# Patient Record
Sex: Female | Born: 1950 | Race: White | Hispanic: No | Marital: Married | Smoking: Never smoker
Health system: Southern US, Community
[De-identification: ages and names within clinical notes are randomized; demographics above are authoritative.]

## PROBLEM LIST (undated history)

## (undated) HISTORY — PX: OTHER SURGICAL HISTORY: SHX169

---

## 1998-04-26 ENCOUNTER — Ambulatory Visit (HOSPITAL_COMMUNITY): Admission: RE | Admit: 1998-04-26 | Discharge: 1998-04-26 | Payer: Self-pay | Admitting: Internal Medicine

## 1998-04-26 ENCOUNTER — Encounter: Payer: Self-pay | Admitting: Internal Medicine

## 1998-06-08 ENCOUNTER — Other Ambulatory Visit: Admission: RE | Admit: 1998-06-08 | Discharge: 1998-06-08 | Payer: Self-pay | Admitting: *Deleted

## 2000-04-02 ENCOUNTER — Ambulatory Visit (HOSPITAL_COMMUNITY): Admission: RE | Admit: 2000-04-02 | Discharge: 2000-04-02 | Payer: Self-pay | Admitting: *Deleted

## 2000-04-29 ENCOUNTER — Other Ambulatory Visit: Admission: RE | Admit: 2000-04-29 | Discharge: 2000-04-29 | Payer: Self-pay | Admitting: *Deleted

## 2001-12-01 ENCOUNTER — Other Ambulatory Visit: Admission: RE | Admit: 2001-12-01 | Discharge: 2001-12-01 | Payer: Self-pay | Admitting: *Deleted

## 2002-02-04 ENCOUNTER — Ambulatory Visit (HOSPITAL_COMMUNITY): Admission: RE | Admit: 2002-02-04 | Discharge: 2002-02-04 | Payer: Self-pay | Admitting: *Deleted

## 2003-02-11 ENCOUNTER — Encounter: Admission: RE | Admit: 2003-02-11 | Discharge: 2003-02-11 | Payer: Self-pay | Admitting: *Deleted

## 2003-05-30 ENCOUNTER — Other Ambulatory Visit: Admission: RE | Admit: 2003-05-30 | Discharge: 2003-05-30 | Payer: Self-pay | Admitting: *Deleted

## 2003-10-13 ENCOUNTER — Encounter: Admission: RE | Admit: 2003-10-13 | Discharge: 2003-10-13 | Payer: Self-pay | Admitting: Internal Medicine

## 2004-05-09 ENCOUNTER — Encounter: Admission: RE | Admit: 2004-05-09 | Discharge: 2004-05-09 | Payer: Self-pay | Admitting: *Deleted

## 2005-05-31 ENCOUNTER — Encounter: Admission: RE | Admit: 2005-05-31 | Discharge: 2005-05-31 | Payer: Self-pay | Admitting: *Deleted

## 2006-10-30 ENCOUNTER — Encounter: Admission: RE | Admit: 2006-10-30 | Discharge: 2006-10-30 | Payer: Self-pay | Admitting: Internal Medicine

## 2007-11-05 ENCOUNTER — Encounter: Admission: RE | Admit: 2007-11-05 | Discharge: 2007-11-05 | Payer: Self-pay | Admitting: Internal Medicine

## 2008-12-22 ENCOUNTER — Encounter: Admission: RE | Admit: 2008-12-22 | Discharge: 2008-12-22 | Payer: Self-pay | Admitting: Obstetrics and Gynecology

## 2010-05-14 ENCOUNTER — Other Ambulatory Visit: Payer: Self-pay | Admitting: Internal Medicine

## 2010-05-14 DIAGNOSIS — Z1231 Encounter for screening mammogram for malignant neoplasm of breast: Secondary | ICD-10-CM

## 2010-05-21 ENCOUNTER — Ambulatory Visit
Admission: RE | Admit: 2010-05-21 | Discharge: 2010-05-21 | Disposition: A | Discharge status: Home / Self Care | Payer: BC Managed Care – PPO | Source: Ambulatory Visit | Attending: Internal Medicine | Admitting: Internal Medicine

## 2010-05-21 DIAGNOSIS — Z1231 Encounter for screening mammogram for malignant neoplasm of breast: Secondary | ICD-10-CM

## 2010-05-23 ENCOUNTER — Other Ambulatory Visit: Payer: Self-pay | Admitting: Internal Medicine

## 2010-05-23 DIAGNOSIS — R928 Other abnormal and inconclusive findings on diagnostic imaging of breast: Secondary | ICD-10-CM

## 2010-05-25 ENCOUNTER — Ambulatory Visit
Admission: RE | Admit: 2010-05-25 | Discharge: 2010-05-25 | Disposition: A | Discharge status: Home / Self Care | Payer: BC Managed Care – PPO | Source: Ambulatory Visit | Attending: Internal Medicine | Admitting: Internal Medicine

## 2010-05-25 DIAGNOSIS — R928 Other abnormal and inconclusive findings on diagnostic imaging of breast: Secondary | ICD-10-CM

## 2011-04-29 ENCOUNTER — Other Ambulatory Visit: Payer: Self-pay | Admitting: Interventional Radiology

## 2011-04-29 DIAGNOSIS — I83819 Varicose veins of unspecified lower extremities with pain: Secondary | ICD-10-CM

## 2011-05-08 ENCOUNTER — Ambulatory Visit
Admission: RE | Admit: 2011-05-08 | Discharge: 2011-05-08 | Disposition: A | Discharge status: Home / Self Care | Payer: BC Managed Care – PPO | Source: Ambulatory Visit | Attending: Interventional Radiology | Admitting: Interventional Radiology

## 2011-05-08 DIAGNOSIS — I83819 Varicose veins of unspecified lower extremities with pain: Secondary | ICD-10-CM

## 2011-06-27 ENCOUNTER — Other Ambulatory Visit: Payer: Self-pay | Admitting: Interventional Radiology

## 2011-06-27 DIAGNOSIS — I83812 Varicose veins of left lower extremities with pain: Secondary | ICD-10-CM

## 2011-08-14 ENCOUNTER — Ambulatory Visit
Admission: RE | Admit: 2011-08-14 | Discharge: 2011-08-14 | Disposition: A | Discharge status: Home / Self Care | Payer: BC Managed Care – PPO | Source: Ambulatory Visit | Attending: Interventional Radiology | Admitting: Interventional Radiology

## 2011-08-14 DIAGNOSIS — I83812 Varicose veins of left lower extremities with pain: Secondary | ICD-10-CM

## 2011-08-19 ENCOUNTER — Other Ambulatory Visit: Payer: Self-pay | Admitting: Interventional Radiology

## 2011-08-19 DIAGNOSIS — I83812 Varicose veins of left lower extremities with pain: Secondary | ICD-10-CM

## 2011-10-17 ENCOUNTER — Ambulatory Visit
Admission: RE | Admit: 2011-10-17 | Discharge: 2011-10-17 | Disposition: A | Discharge status: Home / Self Care | Payer: BC Managed Care – PPO | Source: Ambulatory Visit | Attending: Interventional Radiology | Admitting: Interventional Radiology

## 2011-10-17 VITALS — BP 91/75 | HR 68 | Temp 97.5°F | Resp 16

## 2011-10-17 DIAGNOSIS — I83812 Varicose veins of left lower extremities with pain: Secondary | ICD-10-CM

## 2011-10-17 NOTE — Progress Notes (Signed)
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  0950  22 gauge x 1" insyte catheter started IV Right antecubital (1 attempt) with Saline Lock & 7" extension.  Flushed w/ 10 ml NSS w/o difficulty.  Site unremarkable.  1015 Procedure started. 1110  Procedure completed.  Patient tolerated well.  1120  Wearing thigh high graduated compression garment on LLE.   1130  IV d/c'd.  Catheter intact.  Site unremarkable.  1135  Given verbal & written discharge instructions.  Patient states that she understands.  1140  Ambulated x 10 mins w/o assistance.  Gait steady.  Tolerated well.  1150  Discharged to home.  Husband to drive.

## 2011-11-05 ENCOUNTER — Other Ambulatory Visit: Payer: Self-pay | Admitting: Interventional Radiology

## 2011-11-05 ENCOUNTER — Ambulatory Visit
Admission: RE | Admit: 2011-11-05 | Discharge: 2011-11-05 | Disposition: A | Discharge status: Home / Self Care | Payer: BC Managed Care – PPO | Source: Ambulatory Visit | Attending: Interventional Radiology | Admitting: Interventional Radiology

## 2011-11-05 ENCOUNTER — Ambulatory Visit: Payer: BC Managed Care – PPO

## 2011-11-05 DIAGNOSIS — I83812 Varicose veins of left lower extremities with pain: Secondary | ICD-10-CM

## 2011-11-08 ENCOUNTER — Other Ambulatory Visit: Payer: Self-pay | Admitting: Internal Medicine

## 2011-11-08 DIAGNOSIS — Z1231 Encounter for screening mammogram for malignant neoplasm of breast: Secondary | ICD-10-CM

## 2011-11-11 ENCOUNTER — Other Ambulatory Visit: Payer: BC Managed Care – PPO

## 2011-11-11 ENCOUNTER — Ambulatory Visit: Payer: BC Managed Care – PPO

## 2011-12-03 ENCOUNTER — Ambulatory Visit
Admission: RE | Admit: 2011-12-03 | Discharge: 2011-12-03 | Disposition: A | Discharge status: Home / Self Care | Payer: BC Managed Care – PPO | Source: Ambulatory Visit | Attending: Interventional Radiology | Admitting: Interventional Radiology

## 2011-12-03 DIAGNOSIS — I83812 Varicose veins of left lower extremities with pain: Secondary | ICD-10-CM

## 2011-12-10 ENCOUNTER — Ambulatory Visit
Admission: RE | Admit: 2011-12-10 | Discharge: 2011-12-10 | Disposition: A | Discharge status: Home / Self Care | Payer: BC Managed Care – PPO | Source: Ambulatory Visit | Attending: Internal Medicine | Admitting: Internal Medicine

## 2011-12-10 DIAGNOSIS — Z1231 Encounter for screening mammogram for malignant neoplasm of breast: Secondary | ICD-10-CM

## 2012-03-04 ENCOUNTER — Other Ambulatory Visit (HOSPITAL_COMMUNITY): Payer: Self-pay | Admitting: Interventional Radiology

## 2012-03-04 DIAGNOSIS — I83812 Varicose veins of left lower extremities with pain: Secondary | ICD-10-CM

## 2012-04-15 ENCOUNTER — Ambulatory Visit
Admission: RE | Admit: 2012-04-15 | Discharge: 2012-04-15 | Disposition: A | Discharge status: Home / Self Care | Payer: BC Managed Care – PPO | Source: Ambulatory Visit | Attending: Interventional Radiology | Admitting: Interventional Radiology

## 2012-04-15 DIAGNOSIS — I83812 Varicose veins of left lower extremities with pain: Secondary | ICD-10-CM

## 2012-12-09 ENCOUNTER — Other Ambulatory Visit: Payer: Self-pay

## 2012-12-09 DIAGNOSIS — Z1231 Encounter for screening mammogram for malignant neoplasm of breast: Secondary | ICD-10-CM

## 2013-01-18 ENCOUNTER — Ambulatory Visit
Admission: RE | Admit: 2013-01-18 | Discharge: 2013-01-18 | Disposition: A | Discharge status: Home / Self Care | Payer: BC Managed Care – PPO | Source: Ambulatory Visit

## 2013-01-18 DIAGNOSIS — Z1231 Encounter for screening mammogram for malignant neoplasm of breast: Secondary | ICD-10-CM

## 2013-12-31 ENCOUNTER — Other Ambulatory Visit: Payer: Self-pay

## 2013-12-31 DIAGNOSIS — Z1231 Encounter for screening mammogram for malignant neoplasm of breast: Secondary | ICD-10-CM

## 2014-01-17 ENCOUNTER — Ambulatory Visit
Admission: RE | Admit: 2014-01-17 | Discharge: 2014-01-17 | Disposition: A | Discharge status: Home / Self Care | Payer: BC Managed Care – PPO | Source: Ambulatory Visit

## 2014-01-17 ENCOUNTER — Encounter (INDEPENDENT_AMBULATORY_CARE_PROVIDER_SITE_OTHER): Payer: Self-pay

## 2014-01-17 DIAGNOSIS — Z1231 Encounter for screening mammogram for malignant neoplasm of breast: Secondary | ICD-10-CM

## 2014-03-11 ENCOUNTER — Ambulatory Visit (INDEPENDENT_AMBULATORY_CARE_PROVIDER_SITE_OTHER): Payer: BLUE CROSS/BLUE SHIELD | Admitting: Internal Medicine

## 2014-03-11 DIAGNOSIS — Z23 Encounter for immunization: Secondary | ICD-10-CM | POA: Diagnosis not present

## 2014-03-11 DIAGNOSIS — Z7184 Encounter for health counseling related to travel: Secondary | ICD-10-CM | POA: Insufficient documentation

## 2014-03-11 DIAGNOSIS — Z7189 Other specified counseling: Secondary | ICD-10-CM

## 2014-03-11 MED ORDER — AZITHROMYCIN 500 MG PO TABS: 1000.0000 mg | ORAL_TABLET | Freq: Once | ORAL | Status: DC

## 2014-03-11 NOTE — Progress Notes (Signed)
Subjective:   Lisa Beltran is a 64 y.o. female who presents to the Infectious Disease clinic for travel consultation. Planned departure date: March 2016          Planned return date: 1 week Countries of travel: [place]  Areas in country: rural   Accommodations: hotel Purpose of travel: [employer] Prior travel out of [place]: has been to the same place previously     Objective:   Scheduled Meds: Continuous Infusions: PRN Meds:. Medications: reviewed    Assessment:    No contraindications to travel. none     Plan:    Issues discussed: freshwater swimming, MVA safety, rabies, safe food/water, traveler's diarrhea, website/handouts for more information, what to do if ill upon return and what to do if ill while there. Immunizations recommended: Hepatitis A series. Malaria prophylaxis: not indicated Traveler's diarrhea prophylaxis: azithromycin, has tendon issues. Total duration of visit: 1 Hour. Total time spent on education, counseling, coordination of care: 30 Minutes.

## 2015-02-22 ENCOUNTER — Other Ambulatory Visit: Payer: Self-pay

## 2015-02-22 DIAGNOSIS — Z1231 Encounter for screening mammogram for malignant neoplasm of breast: Secondary | ICD-10-CM

## 2015-03-01 ENCOUNTER — Ambulatory Visit
Admission: RE | Admit: 2015-03-01 | Discharge: 2015-03-01 | Disposition: A | Discharge status: Home / Self Care | Payer: BLUE CROSS/BLUE SHIELD | Source: Ambulatory Visit

## 2015-03-01 DIAGNOSIS — Z1231 Encounter for screening mammogram for malignant neoplasm of breast: Secondary | ICD-10-CM

## 2016-02-29 ENCOUNTER — Other Ambulatory Visit: Payer: Self-pay | Admitting: Internal Medicine

## 2016-02-29 DIAGNOSIS — Z1231 Encounter for screening mammogram for malignant neoplasm of breast: Secondary | ICD-10-CM

## 2016-03-13 ENCOUNTER — Ambulatory Visit
Admission: RE | Admit: 2016-03-13 | Discharge: 2016-03-13 | Disposition: A | Discharge status: Home / Self Care | Payer: BLUE CROSS/BLUE SHIELD | Source: Ambulatory Visit | Attending: Internal Medicine | Admitting: Internal Medicine

## 2016-03-13 DIAGNOSIS — Z1231 Encounter for screening mammogram for malignant neoplasm of breast: Secondary | ICD-10-CM

## 2016-09-19 DIAGNOSIS — H35342 Macular cyst, hole, or pseudohole, left eye: Secondary | ICD-10-CM | POA: Diagnosis not present

## 2016-09-19 DIAGNOSIS — H33102 Unspecified retinoschisis, left eye: Secondary | ICD-10-CM | POA: Diagnosis not present

## 2016-09-19 DIAGNOSIS — H35372 Puckering of macula, left eye: Secondary | ICD-10-CM | POA: Diagnosis not present

## 2016-09-19 DIAGNOSIS — H35412 Lattice degeneration of retina, left eye: Secondary | ICD-10-CM | POA: Diagnosis not present

## 2016-10-14 DIAGNOSIS — D1801 Hemangioma of skin and subcutaneous tissue: Secondary | ICD-10-CM | POA: Diagnosis not present

## 2016-10-14 DIAGNOSIS — L814 Other melanin hyperpigmentation: Secondary | ICD-10-CM | POA: Diagnosis not present

## 2016-10-14 DIAGNOSIS — C44619 Basal cell carcinoma of skin of left upper limb, including shoulder: Secondary | ICD-10-CM | POA: Diagnosis not present

## 2016-10-14 DIAGNOSIS — Z85828 Personal history of other malignant neoplasm of skin: Secondary | ICD-10-CM | POA: Diagnosis not present

## 2016-10-14 DIAGNOSIS — D485 Neoplasm of uncertain behavior of skin: Secondary | ICD-10-CM | POA: Diagnosis not present

## 2016-10-14 DIAGNOSIS — D225 Melanocytic nevi of trunk: Secondary | ICD-10-CM | POA: Diagnosis not present

## 2016-10-14 DIAGNOSIS — R238 Other skin changes: Secondary | ICD-10-CM | POA: Diagnosis not present

## 2016-10-14 DIAGNOSIS — L821 Other seborrheic keratosis: Secondary | ICD-10-CM | POA: Diagnosis not present

## 2016-10-14 DIAGNOSIS — Z23 Encounter for immunization: Secondary | ICD-10-CM | POA: Diagnosis not present

## 2016-10-14 DIAGNOSIS — L570 Actinic keratosis: Secondary | ICD-10-CM | POA: Diagnosis not present

## 2016-11-01 DIAGNOSIS — H35372 Puckering of macula, left eye: Secondary | ICD-10-CM | POA: Diagnosis not present

## 2016-11-01 DIAGNOSIS — H2513 Age-related nuclear cataract, bilateral: Secondary | ICD-10-CM | POA: Diagnosis not present

## 2016-11-01 DIAGNOSIS — H04123 Dry eye syndrome of bilateral lacrimal glands: Secondary | ICD-10-CM | POA: Diagnosis not present

## 2016-11-18 DIAGNOSIS — L905 Scar conditions and fibrosis of skin: Secondary | ICD-10-CM | POA: Diagnosis not present

## 2016-11-18 DIAGNOSIS — L570 Actinic keratosis: Secondary | ICD-10-CM | POA: Diagnosis not present

## 2016-11-18 DIAGNOSIS — C44629 Squamous cell carcinoma of skin of left upper limb, including shoulder: Secondary | ICD-10-CM | POA: Diagnosis not present

## 2016-12-31 DIAGNOSIS — M859 Disorder of bone density and structure, unspecified: Secondary | ICD-10-CM | POA: Diagnosis not present

## 2016-12-31 DIAGNOSIS — R82998 Other abnormal findings in urine: Secondary | ICD-10-CM | POA: Diagnosis not present

## 2016-12-31 DIAGNOSIS — E038 Other specified hypothyroidism: Secondary | ICD-10-CM | POA: Diagnosis not present

## 2016-12-31 DIAGNOSIS — E7849 Other hyperlipidemia: Secondary | ICD-10-CM | POA: Diagnosis not present

## 2017-01-07 DIAGNOSIS — J3089 Other allergic rhinitis: Secondary | ICD-10-CM | POA: Diagnosis not present

## 2017-01-07 DIAGNOSIS — I868 Varicose veins of other specified sites: Secondary | ICD-10-CM | POA: Diagnosis not present

## 2017-01-07 DIAGNOSIS — Z1212 Encounter for screening for malignant neoplasm of rectum: Secondary | ICD-10-CM | POA: Diagnosis not present

## 2017-01-07 DIAGNOSIS — E038 Other specified hypothyroidism: Secondary | ICD-10-CM | POA: Diagnosis not present

## 2017-01-07 DIAGNOSIS — Z1389 Encounter for screening for other disorder: Secondary | ICD-10-CM | POA: Diagnosis not present

## 2017-01-07 DIAGNOSIS — Z23 Encounter for immunization: Secondary | ICD-10-CM | POA: Diagnosis not present

## 2017-01-07 DIAGNOSIS — G43909 Migraine, unspecified, not intractable, without status migrainosus: Secondary | ICD-10-CM | POA: Diagnosis not present

## 2017-01-07 DIAGNOSIS — R3121 Asymptomatic microscopic hematuria: Secondary | ICD-10-CM | POA: Diagnosis not present

## 2017-01-07 DIAGNOSIS — K5909 Other constipation: Secondary | ICD-10-CM | POA: Diagnosis not present

## 2017-01-07 DIAGNOSIS — E7849 Other hyperlipidemia: Secondary | ICD-10-CM | POA: Diagnosis not present

## 2017-01-07 DIAGNOSIS — M859 Disorder of bone density and structure, unspecified: Secondary | ICD-10-CM | POA: Diagnosis not present

## 2017-01-07 DIAGNOSIS — Z681 Body mass index (BMI) 19 or less, adult: Secondary | ICD-10-CM | POA: Diagnosis not present

## 2017-01-07 DIAGNOSIS — Z0000 Encounter for general adult medical examination without abnormal findings: Secondary | ICD-10-CM | POA: Diagnosis not present

## 2017-01-07 DIAGNOSIS — H35379 Puckering of macula, unspecified eye: Secondary | ICD-10-CM | POA: Diagnosis not present

## 2017-01-27 DIAGNOSIS — M859 Disorder of bone density and structure, unspecified: Secondary | ICD-10-CM | POA: Diagnosis not present

## 2017-03-03 ENCOUNTER — Other Ambulatory Visit: Payer: Self-pay | Admitting: Internal Medicine

## 2017-03-03 DIAGNOSIS — Z1231 Encounter for screening mammogram for malignant neoplasm of breast: Secondary | ICD-10-CM

## 2017-03-21 ENCOUNTER — Ambulatory Visit
Admission: RE | Admit: 2017-03-21 | Discharge: 2017-03-21 | Disposition: A | Discharge status: Home / Self Care | Payer: PPO | Source: Ambulatory Visit | Attending: Internal Medicine | Admitting: Internal Medicine

## 2017-03-21 DIAGNOSIS — Z1231 Encounter for screening mammogram for malignant neoplasm of breast: Secondary | ICD-10-CM | POA: Diagnosis not present

## 2017-05-27 DIAGNOSIS — M26609 Unspecified temporomandibular joint disorder, unspecified side: Secondary | ICD-10-CM | POA: Diagnosis not present

## 2017-05-27 DIAGNOSIS — M279 Disease of jaws, unspecified: Secondary | ICD-10-CM | POA: Diagnosis not present

## 2017-06-04 DIAGNOSIS — L570 Actinic keratosis: Secondary | ICD-10-CM | POA: Diagnosis not present

## 2017-06-04 DIAGNOSIS — L821 Other seborrheic keratosis: Secondary | ICD-10-CM | POA: Diagnosis not present

## 2017-06-30 DIAGNOSIS — E038 Other specified hypothyroidism: Secondary | ICD-10-CM | POA: Diagnosis not present

## 2017-09-18 DIAGNOSIS — H35412 Lattice degeneration of retina, left eye: Secondary | ICD-10-CM | POA: Diagnosis not present

## 2017-09-18 DIAGNOSIS — H43812 Vitreous degeneration, left eye: Secondary | ICD-10-CM | POA: Diagnosis not present

## 2017-09-18 DIAGNOSIS — H33102 Unspecified retinoschisis, left eye: Secondary | ICD-10-CM | POA: Diagnosis not present

## 2017-09-18 DIAGNOSIS — H35342 Macular cyst, hole, or pseudohole, left eye: Secondary | ICD-10-CM | POA: Diagnosis not present

## 2017-09-18 DIAGNOSIS — H35372 Puckering of macula, left eye: Secondary | ICD-10-CM | POA: Diagnosis not present

## 2017-10-15 DIAGNOSIS — L821 Other seborrheic keratosis: Secondary | ICD-10-CM | POA: Diagnosis not present

## 2017-10-15 DIAGNOSIS — L814 Other melanin hyperpigmentation: Secondary | ICD-10-CM | POA: Diagnosis not present

## 2017-10-15 DIAGNOSIS — D1801 Hemangioma of skin and subcutaneous tissue: Secondary | ICD-10-CM | POA: Diagnosis not present

## 2017-10-15 DIAGNOSIS — D225 Melanocytic nevi of trunk: Secondary | ICD-10-CM | POA: Diagnosis not present

## 2017-10-15 DIAGNOSIS — L570 Actinic keratosis: Secondary | ICD-10-CM | POA: Diagnosis not present

## 2017-10-15 DIAGNOSIS — Z85828 Personal history of other malignant neoplasm of skin: Secondary | ICD-10-CM | POA: Diagnosis not present

## 2018-01-01 DIAGNOSIS — E038 Other specified hypothyroidism: Secondary | ICD-10-CM | POA: Diagnosis not present

## 2018-01-01 DIAGNOSIS — R82998 Other abnormal findings in urine: Secondary | ICD-10-CM | POA: Diagnosis not present

## 2018-01-01 DIAGNOSIS — M859 Disorder of bone density and structure, unspecified: Secondary | ICD-10-CM | POA: Diagnosis not present

## 2018-01-01 DIAGNOSIS — E7849 Other hyperlipidemia: Secondary | ICD-10-CM | POA: Diagnosis not present

## 2018-01-08 DIAGNOSIS — I868 Varicose veins of other specified sites: Secondary | ICD-10-CM | POA: Diagnosis not present

## 2018-01-08 DIAGNOSIS — J309 Allergic rhinitis, unspecified: Secondary | ICD-10-CM | POA: Diagnosis not present

## 2018-01-08 DIAGNOSIS — M810 Age-related osteoporosis without current pathological fracture: Secondary | ICD-10-CM | POA: Diagnosis not present

## 2018-01-08 DIAGNOSIS — G43909 Migraine, unspecified, not intractable, without status migrainosus: Secondary | ICD-10-CM | POA: Diagnosis not present

## 2018-01-08 DIAGNOSIS — Z681 Body mass index (BMI) 19 or less, adult: Secondary | ICD-10-CM | POA: Diagnosis not present

## 2018-01-08 DIAGNOSIS — E785 Hyperlipidemia, unspecified: Secondary | ICD-10-CM | POA: Diagnosis not present

## 2018-01-08 DIAGNOSIS — Z1389 Encounter for screening for other disorder: Secondary | ICD-10-CM | POA: Diagnosis not present

## 2018-01-08 DIAGNOSIS — H3530 Unspecified macular degeneration: Secondary | ICD-10-CM | POA: Diagnosis not present

## 2018-01-08 DIAGNOSIS — E039 Hypothyroidism, unspecified: Secondary | ICD-10-CM | POA: Diagnosis not present

## 2018-01-08 DIAGNOSIS — Z23 Encounter for immunization: Secondary | ICD-10-CM | POA: Diagnosis not present

## 2018-01-08 DIAGNOSIS — Z0000 Encounter for general adult medical examination without abnormal findings: Secondary | ICD-10-CM | POA: Diagnosis not present

## 2018-01-08 DIAGNOSIS — K5900 Constipation, unspecified: Secondary | ICD-10-CM | POA: Diagnosis not present

## 2018-01-12 ENCOUNTER — Other Ambulatory Visit: Payer: Self-pay | Admitting: Internal Medicine

## 2018-01-12 DIAGNOSIS — E785 Hyperlipidemia, unspecified: Secondary | ICD-10-CM

## 2018-01-28 ENCOUNTER — Ambulatory Visit
Admission: RE | Admit: 2018-01-28 | Discharge: 2018-01-28 | Disposition: A | Discharge status: Home / Self Care | Payer: No Typology Code available for payment source | Source: Ambulatory Visit | Attending: Internal Medicine | Admitting: Internal Medicine

## 2018-01-28 DIAGNOSIS — E785 Hyperlipidemia, unspecified: Secondary | ICD-10-CM

## 2018-03-02 DIAGNOSIS — H2513 Age-related nuclear cataract, bilateral: Secondary | ICD-10-CM | POA: Diagnosis not present

## 2018-03-02 DIAGNOSIS — H5213 Myopia, bilateral: Secondary | ICD-10-CM | POA: Diagnosis not present

## 2018-03-02 DIAGNOSIS — H04123 Dry eye syndrome of bilateral lacrimal glands: Secondary | ICD-10-CM | POA: Diagnosis not present

## 2018-03-02 DIAGNOSIS — H35372 Puckering of macula, left eye: Secondary | ICD-10-CM | POA: Diagnosis not present

## 2018-03-09 ENCOUNTER — Other Ambulatory Visit: Payer: Self-pay | Admitting: Internal Medicine

## 2018-03-09 DIAGNOSIS — Z1231 Encounter for screening mammogram for malignant neoplasm of breast: Secondary | ICD-10-CM

## 2018-03-11 DIAGNOSIS — D3910 Neoplasm of uncertain behavior of unspecified ovary: Secondary | ICD-10-CM | POA: Diagnosis not present

## 2018-03-11 DIAGNOSIS — Z01419 Encounter for gynecological examination (general) (routine) without abnormal findings: Secondary | ICD-10-CM | POA: Diagnosis not present

## 2018-04-02 ENCOUNTER — Ambulatory Visit: Payer: PPO

## 2018-04-09 DIAGNOSIS — R05 Cough: Secondary | ICD-10-CM | POA: Diagnosis not present

## 2018-04-14 ENCOUNTER — Ambulatory Visit: Payer: PPO

## 2018-05-18 ENCOUNTER — Ambulatory Visit: Payer: PPO

## 2018-06-29 ENCOUNTER — Ambulatory Visit
Admission: RE | Admit: 2018-06-29 | Discharge: 2018-06-29 | Disposition: A | Discharge status: Home / Self Care | Payer: PPO | Source: Ambulatory Visit | Attending: Internal Medicine | Admitting: Internal Medicine

## 2018-06-29 ENCOUNTER — Other Ambulatory Visit: Payer: Self-pay

## 2018-06-29 DIAGNOSIS — Z1231 Encounter for screening mammogram for malignant neoplasm of breast: Secondary | ICD-10-CM

## 2018-09-23 DIAGNOSIS — H33102 Unspecified retinoschisis, left eye: Secondary | ICD-10-CM | POA: Diagnosis not present

## 2018-09-23 DIAGNOSIS — H35342 Macular cyst, hole, or pseudohole, left eye: Secondary | ICD-10-CM | POA: Diagnosis not present

## 2018-09-23 DIAGNOSIS — H35372 Puckering of macula, left eye: Secondary | ICD-10-CM | POA: Diagnosis not present

## 2018-09-23 DIAGNOSIS — H25042 Posterior subcapsular polar age-related cataract, left eye: Secondary | ICD-10-CM | POA: Diagnosis not present

## 2018-10-21 DIAGNOSIS — D225 Melanocytic nevi of trunk: Secondary | ICD-10-CM | POA: Diagnosis not present

## 2018-10-21 DIAGNOSIS — Z85828 Personal history of other malignant neoplasm of skin: Secondary | ICD-10-CM | POA: Diagnosis not present

## 2018-10-21 DIAGNOSIS — L570 Actinic keratosis: Secondary | ICD-10-CM | POA: Diagnosis not present

## 2018-10-21 DIAGNOSIS — L820 Inflamed seborrheic keratosis: Secondary | ICD-10-CM | POA: Diagnosis not present

## 2018-10-21 DIAGNOSIS — L814 Other melanin hyperpigmentation: Secondary | ICD-10-CM | POA: Diagnosis not present

## 2018-10-21 DIAGNOSIS — L821 Other seborrheic keratosis: Secondary | ICD-10-CM | POA: Diagnosis not present

## 2018-12-10 ENCOUNTER — Other Ambulatory Visit: Payer: Self-pay

## 2018-12-10 DIAGNOSIS — Z20822 Contact with and (suspected) exposure to covid-19: Secondary | ICD-10-CM

## 2018-12-13 LAB — NOVEL CORONAVIRUS, NAA: SARS-CoV-2, NAA: NOT DETECTED

## 2019-01-04 DIAGNOSIS — E038 Other specified hypothyroidism: Secondary | ICD-10-CM | POA: Diagnosis not present

## 2019-01-04 DIAGNOSIS — M810 Age-related osteoporosis without current pathological fracture: Secondary | ICD-10-CM | POA: Diagnosis not present

## 2019-01-04 DIAGNOSIS — E7849 Other hyperlipidemia: Secondary | ICD-10-CM | POA: Diagnosis not present

## 2019-01-11 DIAGNOSIS — M810 Age-related osteoporosis without current pathological fracture: Secondary | ICD-10-CM | POA: Diagnosis not present

## 2019-01-11 DIAGNOSIS — R3121 Asymptomatic microscopic hematuria: Secondary | ICD-10-CM | POA: Diagnosis not present

## 2019-01-11 DIAGNOSIS — E785 Hyperlipidemia, unspecified: Secondary | ICD-10-CM | POA: Diagnosis not present

## 2019-01-11 DIAGNOSIS — Z0000 Encounter for general adult medical examination without abnormal findings: Secondary | ICD-10-CM | POA: Diagnosis not present

## 2019-01-11 DIAGNOSIS — H35379 Puckering of macula, unspecified eye: Secondary | ICD-10-CM | POA: Diagnosis not present

## 2019-01-11 DIAGNOSIS — H3530 Unspecified macular degeneration: Secondary | ICD-10-CM | POA: Diagnosis not present

## 2019-01-11 DIAGNOSIS — K5900 Constipation, unspecified: Secondary | ICD-10-CM | POA: Diagnosis not present

## 2019-01-11 DIAGNOSIS — G43909 Migraine, unspecified, not intractable, without status migrainosus: Secondary | ICD-10-CM | POA: Diagnosis not present

## 2019-01-11 DIAGNOSIS — I868 Varicose veins of other specified sites: Secondary | ICD-10-CM | POA: Diagnosis not present

## 2019-01-11 DIAGNOSIS — J309 Allergic rhinitis, unspecified: Secondary | ICD-10-CM | POA: Diagnosis not present

## 2019-01-11 DIAGNOSIS — E039 Hypothyroidism, unspecified: Secondary | ICD-10-CM | POA: Diagnosis not present

## 2019-01-11 DIAGNOSIS — Z1331 Encounter for screening for depression: Secondary | ICD-10-CM | POA: Diagnosis not present

## 2019-02-17 ENCOUNTER — Ambulatory Visit: Payer: PPO

## 2019-02-26 ENCOUNTER — Ambulatory Visit: Payer: PPO

## 2019-03-04 DIAGNOSIS — H5211 Myopia, right eye: Secondary | ICD-10-CM | POA: Diagnosis not present

## 2019-03-04 DIAGNOSIS — H35372 Puckering of macula, left eye: Secondary | ICD-10-CM | POA: Diagnosis not present

## 2019-03-04 DIAGNOSIS — H2511 Age-related nuclear cataract, right eye: Secondary | ICD-10-CM | POA: Diagnosis not present

## 2019-03-04 DIAGNOSIS — H25812 Combined forms of age-related cataract, left eye: Secondary | ICD-10-CM | POA: Diagnosis not present

## 2019-03-04 DIAGNOSIS — H5212 Myopia, left eye: Secondary | ICD-10-CM | POA: Diagnosis not present

## 2019-03-06 ENCOUNTER — Ambulatory Visit: Payer: PPO

## 2019-07-01 ENCOUNTER — Other Ambulatory Visit: Payer: Self-pay | Admitting: Internal Medicine

## 2019-07-01 DIAGNOSIS — Z1231 Encounter for screening mammogram for malignant neoplasm of breast: Secondary | ICD-10-CM

## 2019-07-28 DIAGNOSIS — H5712 Ocular pain, left eye: Secondary | ICD-10-CM | POA: Diagnosis not present

## 2019-07-30 ENCOUNTER — Ambulatory Visit: Payer: PPO

## 2019-08-06 ENCOUNTER — Other Ambulatory Visit: Payer: Self-pay

## 2019-08-06 ENCOUNTER — Ambulatory Visit
Admission: RE | Admit: 2019-08-06 | Discharge: 2019-08-06 | Disposition: A | Discharge status: Home / Self Care | Payer: PPO | Source: Ambulatory Visit | Attending: Internal Medicine | Admitting: Internal Medicine

## 2019-08-06 DIAGNOSIS — Z1231 Encounter for screening mammogram for malignant neoplasm of breast: Secondary | ICD-10-CM

## 2019-09-29 ENCOUNTER — Ambulatory Visit (INDEPENDENT_AMBULATORY_CARE_PROVIDER_SITE_OTHER): Payer: PPO | Admitting: Ophthalmology

## 2019-09-29 ENCOUNTER — Other Ambulatory Visit: Payer: Self-pay

## 2019-09-29 ENCOUNTER — Encounter (INDEPENDENT_AMBULATORY_CARE_PROVIDER_SITE_OTHER): Payer: Self-pay | Admitting: Ophthalmology

## 2019-09-29 DIAGNOSIS — H25042 Posterior subcapsular polar age-related cataract, left eye: Secondary | ICD-10-CM | POA: Diagnosis not present

## 2019-09-29 DIAGNOSIS — H35371 Puckering of macula, right eye: Secondary | ICD-10-CM | POA: Diagnosis not present

## 2019-09-29 DIAGNOSIS — H2513 Age-related nuclear cataract, bilateral: Secondary | ICD-10-CM | POA: Diagnosis not present

## 2019-09-29 DIAGNOSIS — H35372 Puckering of macula, left eye: Secondary | ICD-10-CM

## 2019-09-29 NOTE — Assessment & Plan Note (Signed)

## 2019-09-29 NOTE — Assessment & Plan Note (Signed)

## 2019-09-29 NOTE — Patient Instructions (Signed)
Patient to report any new onset of flashes floaters distortion of vision or decline in vision Retinal Detachment  Retinal detachment occurs when the thin membrane that covers the back of the eye (retina) separates (detaches) from the eyeball. This causes vision loss. Vision loss may range from blurriness or cloudiness to complete blindness. Sometimes vision improves or returns after treatment. It is important to get treatment for retinal detachment as soon as possible. There are 3 types of retinal detachment:  Rhegmatogenous. This type: ? Is most common. ? Happens when a tear in the retina lets fluid into the area behind the retina. ? Causes the retina to detach from the layer of cells that is under it (retinal pigment epithelium, RPE).  Tractional. This type: ? Happens when scar tissue on the surface of the retina tightens (contracts). ? Causes the retina to detach from its base.  Exudative. This type happens when a disorder or an injury causes fluid to leak into the area behind the retina. What are the causes? Retinal detachment may be caused by:  Holes or tears in the retina.  Eye injury.  Certain diseases, including diabetes.  Other eye conditions, such as nearsightedness (myopia) or myopia that worsens (degenerative myopia). What increases the risk? You are more likely to develop this condition if you:  Have a family history of retinal detachment.  Are age 3 or older.  Are female.  Have a history of: ? Cataract surgery. ? Eye injury. ? Retinal detachment.  Are nearsighted.  Have diabetes.  Have high blood pressure (hypertension).  Have certain other eye problems. What are the signs or symptoms? Symptoms of retinal detachment include:  Seeing flashes of light.  Seeing shapes (floaters) in front of your eye that may look like wavy lines or cobwebs.  Having blurry vision.  Seeing a floating empty circle in front of you.  Vision loss. This may: ? Be partial  or complete. ? Be temporary or permanent. ? Affect central vision, side (peripheral) vision, or both. Loss of central vision is more likely to be permanent than a loss of peripheral vision is. ? Look as if a curtain is blocking part of your vision. If you develop symptoms of retinal detachment, you should see an eye care provider as soon as possible, ideally within 12-24 hours. How is this diagnosed? This condition may be diagnosed based on:  Your symptoms and medical history.  An eye exam that is done by a health care provider who specializes in eye conditions (ophthalmologist).  Various tests of your vision and eye health.  Imaging of the eye, including ultrasound. How is this treated? Treatment for this condition may include:  Using a focused beam of energy to seal the tear or hole in the retina (laser treatment). If your retina has only a small tear or hole and has not completely detached, this may be the only treatment that you need.  Freezing the area around the tear or hole in the retina (cryopexy). This helps to reattach the retina to the surface of the eyeball.  Surgery to place a small band (scleral buckle) on the eyeball. The scleral buckle pushes the eyeball against the retina so that the retina can be reattached with laser treatment or cryopexy.  Surgery to remove the gel that is inside the white of the eye (vitrectomy). Gas is injected into the white of the eye (sclera) to push the eyeball back against the retina (pneumatic retinopexy). Then, the retina can be reattached with laser  treatment or cryopexy. Follow these instructions at home:  Do not drive or use heavy machinery until your health care provider approves. Ask your health care provider what activities are safe for you.  Take over-the-counter and prescription medicines only as told by your health care provider.  Wear eye protection as needed, such as while using heavy machinery or playing sports.  If you have  diabetes, work with your health care provider to manage your blood sugar (glucose) and blood pressure. High blood glucose and high blood pressure can cause damage to your eyes, which may lead to retinal detachment.  Get an eye exam one or more times a year.  Keep all follow-up visits as told by your health care provider. This is important. Contact a health care provider if:  Your vision is not improving as your health care provider said you could expect.  Your vision gets worse. Get help right away if:  You suddenly see flashing lights or floaters.  You suddenly have a dark area in your field of vision, especially in the lower part. This can lead to a loss of central vision. Summary  Retinal detachment occurs when the thin membrane that covers the back of the eye (retina) separates (detaches) from the eyeball. This condition causes vision loss.  Retinal detachment is uncommon, but it can cause permanent loss of vision.  If you develop symptoms of a retinal detachment, you should see an eye care provider as soon as possible, ideally within 12-24 hours.  Treatment may include laser treatment, freezing the area around the tear or hole in the retina (cryopexy), or surgery. This information is not intended to replace advice given to you by your health care provider. Make sure you discuss any questions you have with your health care provider. Document Revised: 12/20/2016 Document Reviewed: 12/06/2016 Elsevier Patient Education  2020 [employer].

## 2019-09-29 NOTE — Progress Notes (Signed)
09/29/2019     CHIEF COMPLAINT Patient presents for Retina Follow Up   HISTORY OF PRESENT ILLNESS: Lisa Beltran is a 69 y.o. female who presents to the clinic today for:   HPI    Retina Follow Up    Patient presents with  Other.  In both eyes.  This started 1 year ago.  Severity is mild.  Duration of 1 year.  Since onset it is stable.          Comments    1 Year F/U OU  Pt denies noticeable changes to [place] OU since last visit. Pt denies ocular pain, flashes of light, or floaters OU.         Last edited by Rockie Neighbours, [place] on 09/29/2019  8:15 AM. (History)      Referring physician: Shon Baton, MD [place],  [place] [address]  HISTORICAL INFORMATION:   Selected notes from the [facility]: No current outpatient medications on file. (Ophthalmic Drugs)   No current facility-administered medications for this visit. (Ophthalmic Drugs)   Current Outpatient Medications (Other)  Medication Sig  . azithromycin (ZITHROMAX) 500 MG tablet Take 2 tablets (1,000 mg total) by mouth once. Take 2 tabs once for Traveler's diarrhea  . meloxicam (MOBIC) 15 MG tablet   . SYNTHROID 75 MCG tablet Take 75 mcg by mouth daily.   No current facility-administered medications for this visit. (Other)      REVIEW OF SYSTEMS:    ALLERGIES Allergies  Allergen Reactions  . Penicillins Rash    PAST MEDICAL HISTORY History reviewed. No pertinent past medical history. History reviewed. No pertinent surgical history.  FAMILY HISTORY History reviewed. No pertinent family history.  SOCIAL HISTORY Social History   Tobacco Use  . Smoking status: Never Smoker  . Smokeless tobacco: Never Used  Substance Use Topics  . Alcohol use: Yes    Comment: 1 drink/week  . Drug use: No         OPHTHALMIC EXAM: Base Eye Exam    Visual Acuity (ETDRS)      Right Left   Dist cc 20/30 -2 20/25 -2   Dist ph cc 20/25 -1    Correction:  Glasses       Tonometry (Tonopen, 8:16 AM)      Right Left   Pressure 09 11       Pupils      Pupils Dark Light Shape React APD   Right PERRL 4 3 Round Brisk None   Left PERRL 4 3 Round Brisk None       Visual Fields (Counting fingers)      Left Right    Full Full       Extraocular Movement      Right Left    Full Full       Neuro/Psych    Oriented x3: Yes   Mood/Affect: Normal       Dilation    Both eyes: 1.0% Mydriacyl, 2.5% Phenylephrine @ 8:19 AM        Slit Lamp and Fundus Exam    External Exam      Right Left   External Normal Normal       Slit Lamp Exam      Right Left   Lids/Lashes Normal Normal   Conjunctiva/Sclera White and quiet White and quiet   Cornea Clear Clear   Anterior Chamber Deep and quiet Deep and quiet  Iris Round and reactive Round and reactive   Lens 1+ Nuclear sclerosis 1+ Nuclear sclerosis   Anterior Vitreous Normal Normal       Fundus Exam      Right Left   Posterior Vitreous Posterior vitreous detachment Posterior vitreous detachment   Disc Normal Normal   C/D Ratio 0.35 0.35   Macula Normal Epiretinal membrane, no topographic distortion   Vessels Normal Normal   Periphery Lattice degeneration inferiorly, no retinal holes or breaks. Lattice degeneration inferiorly, no retinal holes or breaks.          IMAGING AND PROCEDURES  Imaging and Procedures for 09/29/19  OCT, Retina - OU - Both Eyes       Right Eye Quality was good. Scan locations included subfoveal. Central Foveal Thickness: 308. Progression has improved.   Left Eye Quality was good. Scan locations included subfoveal. Central Foveal Thickness: 351. Progression has improved. Findings include cystoid macular edema, epiretinal membrane.   Notes No topographic distortion to the macula left eye from mild vitreal macular traction and epiretinal membrane.  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 The patient was reassured that surgery at a later date does not put the patient at risk for a worse outcome. It was emphasized that the need for surgery is dictated by the patient's quality of life as influenced by the cataract. Patient was instructed to maintain close follow up with their general eye care doctor.  Left epiretinal membrane The nature of macular pucker (epiretinal membrane ERM) was discussed with the patient as well as threshold criteria for vitrectomy surgery. I explained that in rare cases another surgery is needed to actually remove a second wrinkle should it regrow.  Most often, the epiretinal membrane and underlying wrinkled internal limiting membrane are removed with the first surgery, to accomplish the goals.   If the operative eye is [place] (natural lens still present), cataract surgery is often recommended prior to Vitrectomy. This will enable the retina surgeon to have the best view during surgery and the patient to obtain optimal results in the future. Treatment options were discussed.      ICD-10-CM   1. Left epiretinal membrane  H35.372 OCT, Retina - OU - Both Eyes  2. 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 Right epiretinal membrane  H35.371 OCT, Retina - OU - Both Eyes  4. Nuclear sclerotic cataract of both eyes  H25.13     1.  I described the nature of epiretinal membrane if there is no current effect on the fovea of the left or the right eye.  2.  Moreover I use the analogy of sunglasses for describing cataract developing symptoms in either eye usually affecting nighttime vision or dark rainy days initially.  3.  Retinal tears are not present in either eye.  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  No new findings today.  Observe  Ophthalmic Meds  Ordered this visit:  No orders of the defined types were placed in this encounter.      Return in about 1 year (around 09/28/2020) for DILATE OU, OCT.  There are no Patient Instructions on file for this visit.   Explained the diagnoses, plan, and  follow up with the patient and they expressed understanding.  Patient expressed understanding of the importance of proper follow up care.   Clent Demark Yaron Grasse M.D. Diseases & Surgery of the Retina and Vitreous Retina & Diabetic [place] 09/29/19     Abbreviations: M myopia (nearsighted); A astigmatism; H hyperopia (farsighted); P presbyopia; Mrx spectacle prescription;  CTL contact lenses; OD right eye; OS left eye; OU both eyes  XT exotropia; ET esotropia; PEK punctate epithelial keratitis; PEE punctate epithelial erosions; DES dry eye syndrome; MGD meibomian gland dysfunction; ATs artificial tears; PFAT's preservative free artificial tears; [place] nuclear sclerotic cataract; PSC posterior subcapsular cataract; ERM epi-retinal membrane; PVD posterior vitreous detachment; RD retinal detachment; DM diabetes mellitus; DR diabetic retinopathy; NPDR non-proliferative diabetic retinopathy; PDR proliferative diabetic retinopathy; CSME clinically significant macular edema; DME diabetic macular edema; dbh dot blot hemorrhages; CWS cotton wool spot; POAG primary open angle glaucoma; C/D cup-to-disc ratio; HVF humphrey visual field; GVF goldmann visual field; OCT optical coherence tomography; IOP intraocular pressure; BRVO Branch retinal vein occlusion; CRVO central retinal vein occlusion; CRAO central retinal artery occlusion; BRAO branch retinal artery occlusion; RT retinal tear; SB scleral buckle; PPV pars plana vitrectomy; VH Vitreous hemorrhage; PRP panretinal laser photocoagulation; IVK intravitreal kenalog; VMT vitreomacular traction; MH Macular hole;  NVD neovascularization of the disc; NVE neovascularization elsewhere; AREDS age related eye disease study; ARMD  age related macular degeneration; POAG primary open angle glaucoma; EBMD epithelial/anterior basement membrane dystrophy; ACIOL anterior chamber intraocular lens; IOL intraocular lens; PCIOL posterior chamber intraocular lens; Phaco/IOL phacoemulsification with intraocular lens placement; [place] photorefractive keratectomy; LASIK laser assisted in situ keratomileusis; HTN hypertension; DM diabetes mellitus; COPD chronic obstructive pulmonary disease

## 2019-10-19 DIAGNOSIS — L570 Actinic keratosis: Secondary | ICD-10-CM | POA: Diagnosis not present

## 2019-10-19 DIAGNOSIS — D485 Neoplasm of uncertain behavior of skin: Secondary | ICD-10-CM | POA: Diagnosis not present

## 2019-10-19 DIAGNOSIS — L578 Other skin changes due to chronic exposure to nonionizing radiation: Secondary | ICD-10-CM | POA: Diagnosis not present

## 2019-10-19 DIAGNOSIS — L821 Other seborrheic keratosis: Secondary | ICD-10-CM | POA: Diagnosis not present

## 2019-10-19 DIAGNOSIS — Z85828 Personal history of other malignant neoplasm of skin: Secondary | ICD-10-CM | POA: Diagnosis not present

## 2019-10-19 DIAGNOSIS — D225 Melanocytic nevi of trunk: Secondary | ICD-10-CM | POA: Diagnosis not present

## 2019-10-19 DIAGNOSIS — L814 Other melanin hyperpigmentation: Secondary | ICD-10-CM | POA: Diagnosis not present

## 2019-10-19 DIAGNOSIS — L820 Inflamed seborrheic keratosis: Secondary | ICD-10-CM | POA: Diagnosis not present

## 2019-11-18 DIAGNOSIS — L570 Actinic keratosis: Secondary | ICD-10-CM | POA: Diagnosis not present

## 2020-03-22 DIAGNOSIS — H25812 Combined forms of age-related cataract, left eye: Secondary | ICD-10-CM | POA: Diagnosis not present

## 2020-03-22 DIAGNOSIS — H04123 Dry eye syndrome of bilateral lacrimal glands: Secondary | ICD-10-CM | POA: Diagnosis not present

## 2020-03-22 DIAGNOSIS — H35372 Puckering of macula, left eye: Secondary | ICD-10-CM | POA: Diagnosis not present

## 2020-03-22 DIAGNOSIS — H2511 Age-related nuclear cataract, right eye: Secondary | ICD-10-CM | POA: Diagnosis not present

## 2020-08-05 DIAGNOSIS — Z20822 Contact with and (suspected) exposure to covid-19: Secondary | ICD-10-CM | POA: Diagnosis not present

## 2020-08-24 ENCOUNTER — Other Ambulatory Visit: Payer: Self-pay

## 2020-08-24 ENCOUNTER — Ambulatory Visit
Admission: RE | Admit: 2020-08-24 | Discharge: 2020-08-24 | Disposition: A | Discharge status: Home / Self Care | Payer: PPO | Source: Ambulatory Visit | Attending: Internal Medicine | Admitting: Internal Medicine

## 2020-08-24 ENCOUNTER — Other Ambulatory Visit: Payer: Self-pay | Admitting: Internal Medicine

## 2020-08-24 DIAGNOSIS — Z1231 Encounter for screening mammogram for malignant neoplasm of breast: Secondary | ICD-10-CM | POA: Diagnosis not present

## 2020-09-28 ENCOUNTER — Encounter (INDEPENDENT_AMBULATORY_CARE_PROVIDER_SITE_OTHER): Payer: PPO | Admitting: Ophthalmology

## 2020-10-16 ENCOUNTER — Other Ambulatory Visit: Payer: Self-pay

## 2020-10-16 ENCOUNTER — Ambulatory Visit (INDEPENDENT_AMBULATORY_CARE_PROVIDER_SITE_OTHER): Payer: PPO | Admitting: Ophthalmology

## 2020-10-16 ENCOUNTER — Encounter (INDEPENDENT_AMBULATORY_CARE_PROVIDER_SITE_OTHER): Payer: Self-pay | Admitting: Ophthalmology

## 2020-10-16 DIAGNOSIS — H2513 Age-related nuclear cataract, bilateral: Secondary | ICD-10-CM

## 2020-10-16 DIAGNOSIS — H35372 Puckering of macula, left eye: Secondary | ICD-10-CM

## 2020-10-16 DIAGNOSIS — H25042 Posterior subcapsular polar age-related cataract, left eye: Secondary | ICD-10-CM | POA: Diagnosis not present

## 2020-10-16 DIAGNOSIS — H35371 Puckering of macula, right eye: Secondary | ICD-10-CM

## 2020-10-16 NOTE — Assessment & Plan Note (Addendum)
The nature of cataract was discussed with the patient as well as the elective nature of surgery. The patient was reassured that surgery at a later date does not put the patient at risk for a worse outcome. It was emphasized that the need for surgery is dictated by the patient's quality of life as influenced by the cataract. Patient was instructed to maintain close follow up with their general eye care doctor.  This is a new finding and I explained to the patient that she might start to develop glare and should follow-up with [facility] for further evaluation and monitoring

## 2020-10-16 NOTE — Assessment & Plan Note (Signed)
Minor with no topographic distortion we will continue to observe

## 2020-10-16 NOTE — Progress Notes (Signed)
10/16/2020     CHIEF COMPLAINT Patient presents for  Chief Complaint  Patient presents with   Retina Follow Up      HISTORY OF PRESENT ILLNESS: Lisa Beltran is a 70 y.o. female who presents to the clinic today for:   HPI     Retina Follow Up   Patient presents with  Other.  In both eyes.  This started 1 year ago.  Duration of 1 year.        Comments   1 year f/u OU with OCT  Pt states she is not able to determine if her vision has changed much over the past year. Pt states she thinks her vision may seem a bit worse when she is not wearing her CL's. Pt denies any new flashes or floaters. Pt denies any eye pain.  Eye Meds: None      Last edited by Lisa Beltran, COA on 10/16/2020  8:28 AM.      Referring physician: Shon Baton, MD [address] [place],  [place] [address]  HISTORICAL INFORMATION:   Selected notes from the [facility]: No current outpatient medications on file. (Ophthalmic Drugs)   No current facility-administered medications for this visit. (Ophthalmic Drugs)   Current Outpatient Medications (Other)  Medication Sig   azithromycin (ZITHROMAX) 500 MG tablet Take 2 tablets (1,000 mg total) by mouth once. 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   No current facility-administered medications for this visit. (Other)      REVIEW OF SYSTEMS:    ALLERGIES Allergies  Allergen Reactions   Penicillins Rash    PAST MEDICAL HISTORY History reviewed. No pertinent past medical history. History reviewed. No pertinent surgical history.  FAMILY HISTORY History reviewed. No pertinent family history.  SOCIAL HISTORY Social History   Tobacco Use   Smoking status: Never   Smokeless tobacco: Never  Substance Use Topics   Alcohol use: Yes    Comment: 1 drink/week   Drug use: No         OPHTHALMIC  EXAM:  Base Eye Exam     Visual Acuity (ETDRS)       Right Left   Dist cc 20/40 +2 20/25 -2   Dist ph cc 20/30 -1     Correction: Contacts         Tonometry (Tonopen, 8:35 AM)       Right Left   Pressure 10 8         Pupils       Pupils Dark Light Shape React APD   Right PERRL 5 4 Round Brisk None   Left PERRL 5 4 Round Brisk None         Visual Fields (Counting fingers)       Left Right    Full Full         Extraocular Movement       Right Left    Full, Ortho Full, Ortho         Neuro/Psych     Oriented x3: Yes   Mood/Affect: Normal         Dilation     Both eyes: 1.0% Mydriacyl, 2.5% Phenylephrine @ 8:35 AM           Slit Lamp and Fundus Exam     External Exam  Right Left   External Normal Normal         Slit Lamp Exam       Right Left   Lids/Lashes Normal Normal   Conjunctiva/Sclera White and quiet White and quiet   Cornea Clear Clear   Anterior Chamber Deep and quiet Deep and quiet   Iris Round and reactive Round and reactive   Lens 1+ Nuclear sclerosis 1+ Nuclear sclerosis, 1+ Posterior subcapsular cataract   Anterior Vitreous Normal Normal         Fundus Exam       Right Left   Posterior Vitreous Posterior vitreous detachment Posterior vitreous detachment   Disc Normal Normal   C/D Ratio 0.35 0.35   Macula Normal,  Epiretinal membrane, no topographic distortion   Vessels Normal Normal   Periphery Lattice degeneration inferiorly, no retinal holes or breaks. Lattice degeneration inferiorly, no retinal holes or breaks.            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 The patient was reassured that surgery at a later date does not put the patient at risk for a worse outcome. It was  emphasized that the need for surgery is dictated by the patient's quality of life as influenced by the cataract. Patient was instructed to maintain close follow up with their general eye care doctor.  This is a new finding and I explained to the patient that she might start to develop glare and should follow-up with [facility] for further evaluation and monitoring  Left epiretinal membrane Mild epiretinal membrane still no topographic distortion no impact on acuity OS observe  Nuclear sclerotic cataract of both eyes Bilateral mild NSC changes.  No impact on acuity  Right epiretinal membrane Minor with no topographic distortion we will continue to observe     ICD-10-CM   1. Left epiretinal membrane  H35.372 OCT, Retina - OU - Both Eyes    2. 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 Nuclear sclerotic cataract of both eyes  H25.13     4. Right epiretinal membrane  H35.371       1.  OU with minor epiretinal membrane with no impact on acuity  2.  OU with minor nuclear sclerotic changes yet today OS has new onset PSC finding per my exam.  This may be a small posterior pole or and may be not visually significant yet patient was reviewed of the symptoms.  Follow-up with [facility] regarding this issue as scheduled February 2023  3.  Ophthalmic Meds Ordered this visit:  No orders of the defined types were placed in this encounter.      Return in about 1 year (around 10/16/2021) for DILATE OU, COLOR FP, OCT.  There are no Patient Instructions on file for this visit.   Explained the diagnoses, plan, and follow up with the patient and they expressed understanding.  Patient expressed understanding of the importance of proper follow up care.   Lisa Beltran M.D. Diseases & Surgery of the Retina and Vitreous Retina & Diabetic [place] 10/16/20     Abbreviations: M myopia (nearsighted); A astigmatism; H hyperopia (farsighted); P  presbyopia; Mrx spectacle prescription;  CTL contact lenses; OD right  eye; OS left eye; OU both eyes  XT exotropia; ET esotropia; PEK punctate epithelial keratitis; PEE punctate epithelial erosions; DES dry eye syndrome; MGD meibomian gland dysfunction; ATs artificial tears; PFAT's preservative free artificial tears; [place] nuclear sclerotic cataract; PSC posterior subcapsular cataract; ERM epi-retinal membrane; PVD posterior vitreous detachment; RD retinal detachment; DM diabetes mellitus; DR diabetic retinopathy; NPDR non-proliferative diabetic retinopathy; PDR proliferative diabetic retinopathy; CSME clinically significant macular edema; DME diabetic macular edema; dbh dot blot hemorrhages; CWS cotton wool spot; POAG primary open angle glaucoma; C/D cup-to-disc ratio; HVF humphrey visual field; GVF goldmann visual field; OCT optical coherence tomography; IOP intraocular pressure; BRVO Branch retinal vein occlusion; CRVO central retinal vein occlusion; CRAO central retinal artery occlusion; BRAO branch retinal artery occlusion; RT retinal tear; SB scleral buckle; PPV pars plana vitrectomy; VH Vitreous hemorrhage; PRP panretinal laser photocoagulation; IVK intravitreal kenalog; VMT vitreomacular traction; MH Macular hole;  NVD neovascularization of the disc; NVE neovascularization elsewhere; AREDS age related eye disease study; ARMD age related macular degeneration; POAG primary open angle glaucoma; EBMD epithelial/anterior basement membrane dystrophy; ACIOL anterior chamber intraocular lens; IOL intraocular lens; PCIOL posterior chamber intraocular lens; Phaco/IOL phacoemulsification with intraocular lens placement; [place] photorefractive keratectomy; LASIK laser assisted in situ keratomileusis; HTN hypertension; DM diabetes mellitus; COPD chronic obstructive pulmonary disease

## 2020-10-16 NOTE — Assessment & Plan Note (Signed)
Mild epiretinal membrane still no topographic distortion no impact on acuity OS observe

## 2020-10-16 NOTE — Assessment & Plan Note (Signed)
Bilateral mild NSC changes.  No impact on acuity

## 2020-10-17 DIAGNOSIS — M859 Disorder of bone density and structure, unspecified: Secondary | ICD-10-CM | POA: Diagnosis not present

## 2020-10-17 DIAGNOSIS — E039 Hypothyroidism, unspecified: Secondary | ICD-10-CM | POA: Diagnosis not present

## 2020-10-17 DIAGNOSIS — E785 Hyperlipidemia, unspecified: Secondary | ICD-10-CM | POA: Diagnosis not present

## 2020-10-18 DIAGNOSIS — L821 Other seborrheic keratosis: Secondary | ICD-10-CM | POA: Diagnosis not present

## 2020-10-18 DIAGNOSIS — L820 Inflamed seborrheic keratosis: Secondary | ICD-10-CM | POA: Diagnosis not present

## 2020-10-18 DIAGNOSIS — L570 Actinic keratosis: Secondary | ICD-10-CM | POA: Diagnosis not present

## 2020-10-18 DIAGNOSIS — L814 Other melanin hyperpigmentation: Secondary | ICD-10-CM | POA: Diagnosis not present

## 2020-10-18 DIAGNOSIS — D225 Melanocytic nevi of trunk: Secondary | ICD-10-CM | POA: Diagnosis not present

## 2020-10-18 DIAGNOSIS — C44612 Basal cell carcinoma of skin of right upper limb, including shoulder: Secondary | ICD-10-CM | POA: Diagnosis not present

## 2020-10-18 DIAGNOSIS — L578 Other skin changes due to chronic exposure to nonionizing radiation: Secondary | ICD-10-CM | POA: Diagnosis not present

## 2020-10-18 DIAGNOSIS — Z85828 Personal history of other malignant neoplasm of skin: Secondary | ICD-10-CM | POA: Diagnosis not present

## 2020-10-18 DIAGNOSIS — D485 Neoplasm of uncertain behavior of skin: Secondary | ICD-10-CM | POA: Diagnosis not present

## 2020-10-24 DIAGNOSIS — Z0000 Encounter for general adult medical examination without abnormal findings: Secondary | ICD-10-CM | POA: Diagnosis not present

## 2020-10-24 DIAGNOSIS — M25519 Pain in unspecified shoulder: Secondary | ICD-10-CM | POA: Diagnosis not present

## 2020-10-24 DIAGNOSIS — E039 Hypothyroidism, unspecified: Secondary | ICD-10-CM | POA: Diagnosis not present

## 2020-10-24 DIAGNOSIS — I868 Varicose veins of other specified sites: Secondary | ICD-10-CM | POA: Diagnosis not present

## 2020-10-24 DIAGNOSIS — M810 Age-related osteoporosis without current pathological fracture: Secondary | ICD-10-CM | POA: Diagnosis not present

## 2020-10-24 DIAGNOSIS — K5900 Constipation, unspecified: Secondary | ICD-10-CM | POA: Diagnosis not present

## 2020-10-24 DIAGNOSIS — G43909 Migraine, unspecified, not intractable, without status migrainosus: Secondary | ICD-10-CM | POA: Diagnosis not present

## 2020-10-24 DIAGNOSIS — R636 Underweight: Secondary | ICD-10-CM | POA: Diagnosis not present

## 2020-10-24 DIAGNOSIS — E785 Hyperlipidemia, unspecified: Secondary | ICD-10-CM | POA: Diagnosis not present

## 2020-10-24 DIAGNOSIS — H35379 Puckering of macula, unspecified eye: Secondary | ICD-10-CM | POA: Diagnosis not present

## 2020-10-24 DIAGNOSIS — R82998 Other abnormal findings in urine: Secondary | ICD-10-CM | POA: Diagnosis not present

## 2020-11-27 DIAGNOSIS — M810 Age-related osteoporosis without current pathological fracture: Secondary | ICD-10-CM | POA: Diagnosis not present

## 2020-11-28 DIAGNOSIS — C44612 Basal cell carcinoma of skin of right upper limb, including shoulder: Secondary | ICD-10-CM | POA: Diagnosis not present

## 2020-11-28 DIAGNOSIS — Z23 Encounter for immunization: Secondary | ICD-10-CM | POA: Diagnosis not present

## 2021-01-18 DIAGNOSIS — Z20822 Contact with and (suspected) exposure to covid-19: Secondary | ICD-10-CM | POA: Diagnosis not present

## 2021-01-30 DIAGNOSIS — H0015 Chalazion left lower eyelid: Secondary | ICD-10-CM | POA: Diagnosis not present

## 2021-02-20 DIAGNOSIS — H0015 Chalazion left lower eyelid: Secondary | ICD-10-CM | POA: Diagnosis not present

## 2021-03-22 ENCOUNTER — Encounter (INDEPENDENT_AMBULATORY_CARE_PROVIDER_SITE_OTHER): Payer: Self-pay

## 2021-03-22 DIAGNOSIS — H25812 Combined forms of age-related cataract, left eye: Secondary | ICD-10-CM | POA: Diagnosis not present

## 2021-03-22 DIAGNOSIS — H33301 Unspecified retinal break, right eye: Secondary | ICD-10-CM | POA: Diagnosis not present

## 2021-03-22 DIAGNOSIS — H5211 Myopia, right eye: Secondary | ICD-10-CM | POA: Diagnosis not present

## 2021-03-22 DIAGNOSIS — H2511 Age-related nuclear cataract, right eye: Secondary | ICD-10-CM | POA: Diagnosis not present

## 2021-03-26 ENCOUNTER — Ambulatory Visit (INDEPENDENT_AMBULATORY_CARE_PROVIDER_SITE_OTHER): Payer: PPO | Admitting: Ophthalmology

## 2021-03-26 ENCOUNTER — Encounter (INDEPENDENT_AMBULATORY_CARE_PROVIDER_SITE_OTHER): Payer: Self-pay | Admitting: Ophthalmology

## 2021-03-26 ENCOUNTER — Other Ambulatory Visit: Payer: Self-pay

## 2021-03-26 DIAGNOSIS — H2513 Age-related nuclear cataract, bilateral: Secondary | ICD-10-CM | POA: Diagnosis not present

## 2021-03-26 DIAGNOSIS — H35372 Puckering of macula, left eye: Secondary | ICD-10-CM | POA: Diagnosis not present

## 2021-03-26 DIAGNOSIS — H33311 Horseshoe tear of retina without detachment, right eye: Secondary | ICD-10-CM | POA: Diagnosis not present

## 2021-03-26 DIAGNOSIS — H35371 Puckering of macula, right eye: Secondary | ICD-10-CM

## 2021-03-26 NOTE — Assessment & Plan Note (Signed)
Ongoing follow-up per Dr. Wyatt Portela ?

## 2021-03-26 NOTE — Assessment & Plan Note (Signed)
Minor, no impact on acuity OD ?

## 2021-03-26 NOTE — Assessment & Plan Note (Signed)
Minor currently no impact on acuity ?

## 2021-03-26 NOTE — Progress Notes (Signed)
03/26/2021     CHIEF COMPLAINT Patient presents for  Chief Complaint  Patient presents with   Retina Evaluation      HISTORY OF PRESENT ILLNESS: Lisa Beltran is a 71 y.o. female who presents to the clinic today for:   HPI     Retina Evaluation           Laterality: left eye         Comments   WIP- retinal evaluation to clear for CE IOL, OS, referred by Dr. Katy Fitch. Last appointment with Dr. Zadie Rhine was 10/16/2020. Pt states "I had an appointment with Dr. Katy Fitch last week 3/2 and he noticed a tear in my retina in my right eye which I have had in the past. My vision has not been as good in my right eye and I noticed a change since I had COVID in 2020, no flashing and no new floaters, I always have floaters."  Pt states she had a stye under her left eye and it got infected and was using erythromycin ointment and it didn't clear up for a week so I saw Dr. Katy Fitch and they had me treat it with a heat mask to make it go away.      Last edited by Laurin Coder on 03/26/2021  2:24 PM.      Referring physician: Debbra Riding, MD [address] [place],  [place] [address]  HISTORICAL INFORMATION:   Selected notes from the MEDICAL RECORD NUMBER       CURRENT MEDICATIONS: No current outpatient medications on file. (Ophthalmic Drugs)   No current facility-administered medications for this visit. (Ophthalmic Drugs)   Current Outpatient Medications (Other)  Medication Sig   azithromycin (ZITHROMAX) 500 MG tablet Take 2 tablets (1,000 mg total) by mouth once. 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   No current facility-administered medications for this visit. (Other)      REVIEW OF SYSTEMS:    ALLERGIES Allergies  Allergen Reactions   Penicillins Rash    PAST MEDICAL HISTORY History reviewed. No pertinent past medical history. History reviewed. No pertinent  surgical history.  FAMILY HISTORY History reviewed. No pertinent family history.  SOCIAL HISTORY Social History   Tobacco Use   Smoking status: Never   Smokeless tobacco: Never  Substance Use Topics   Alcohol use: Yes    Comment: 1 drink/week   Drug use: No         OPHTHALMIC EXAM:  Base Eye Exam     Visual Acuity (ETDRS)       Right Left   Dist cc 20/25 -1 20/20 -1    Correction: Glasses         Tonometry (Tonopen, 2:27 PM)       Right Left   Pressure 14 14         Pupils       Pupils Dark Light APD   Right PERRL 4 3 None   Left PERRL 4 3 None         Visual Fields (Counting fingers)       Left Right    Full Full         Extraocular Movement       Right Left    Full Full         Neuro/Psych     Oriented x3:  Yes   Mood/Affect: Normal         Dilation     Both eyes: 1.0% Mydriacyl, 2.5% Phenylephrine @ 2:27 PM           Slit Lamp and Fundus Exam     External Exam       Right Left   External Normal Normal         Slit Lamp Exam       Right Left   Lids/Lashes Normal Normal   Conjunctiva/Sclera White and quiet White and quiet   Cornea Clear Clear   Anterior Chamber Deep and quiet Deep and quiet   Iris Round and reactive Round and reactive   Lens 1+ Nuclear sclerosis 1+ Nuclear sclerosis, 1+ Posterior subcapsular cataract   Anterior Vitreous Normal Normal         Fundus Exam       Right Left   Posterior Vitreous Posterior vitreous detachment Posterior vitreous detachment   Disc Normal Normal   C/D Ratio 0.35 0.35   Macula Normal,  Epiretinal membrane, no topographic distortion   Vessels Normal Normal   Periphery Lattice degeneration inferiorly, no retinal holes or breaks.  New small HST, horseshoe tear peripherally at 930-10 meridian.  No treatment in place.   Lattice degeneration inferiorly, no retinal holes or breaks.            IMAGING AND PROCEDURES  Imaging and Procedures for  03/26/21  OCT, Retina - OU - Both Eyes       Right Eye Quality was good. Scan locations included subfoveal. Central Foveal Thickness: 324. Progression has improved. Findings include abnormal foveal contour, epiretinal membrane.   Left Eye Quality was good. Scan locations included subfoveal. Central Foveal Thickness: 358. Progression has improved. Findings include cystoid macular edema, epiretinal membrane, abnormal foveal contour.   Notes No topographic distortion to the macula left eye from mild vitreal macular traction and epiretinal membrane.  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 Disc findings include normal observations. Macula : normal observations.   Left Eye Progression has no prior data.   Notes Peripheral retinal break or she tear centered at 930-10 meridian peripherally, no treatment present will need laser treatment for retinopexy purposes OD.  Region at 730 and 8 as well as at 6 meridian as well treated in the past  OS , Good laser retinopexy OS inferotemporal and inferiorly, no new breaks     Repair Retinal Breaks, Laser - OD - Right Eye       Tear locations include temporal, superior.   Time Out Confirmed correct patient, procedure, site, and patient consented.   Anesthesia Topical anesthesia was used. Anesthetic medications included Proparacaine 0.5%.   Laser Information The type of laser was diode. Color was yellow. The duration in seconds was 0.03. The spot size was 390 microns. Laser power was 300. Total spots was 124.   Post-op The patient tolerated the procedure well. There were no complications. The patient received written and verbal post procedure care education.   Notes Horseshoe retinal tear with thin rim of cerumen retinal fluid surrounding centered at 930-10 position treated today laser retinopexy              ASSESSMENT/PLAN:  Horseshoe retinal tear of right eye New horseshoe retinal break temporally, centered at -10 meridian  laser retinopexy will be delivered today completed without location  Nuclear sclerotic cataract of both eyes Ongoing follow-up per Dr. Wyatt Portela  Right epiretinal membrane Minor, no impact on acuity OD  Left epiretinal membrane Minor currently no impact on acuity     ICD-10-CM   1. Horseshoe retinal tear of right eye  H33.311 Repair Retinal Breaks, Laser - OD - Right Eye    2. Left epiretinal membrane  H35.372 OCT, Retina - OU - Both Eyes    Color Fundus Photography Optos - OU - Both Eyes    3. Nuclear sclerotic cataract of both eyes  H25.13     4. Right epiretinal membrane  H35.371       1.  3 rows of laser retinopexy applied with navigated laser without difficulty and excellent graphically confirmed fashion.  OD.  2.  Patient to report any new onset symptoms of massive change in floaters, curtain of darkness or or bright sparkling flashes of light  3.  Ophthalmic Meds Ordered this visit:  No orders of the defined types were placed in this encounter.      Return in about 4 months (around 07/26/2021) for DILATE OU, COLOR FP.  There are no Patient Instructions on file for this visit.   Explained the diagnoses, plan, and follow up with the patient and they expressed understanding.  Patient expressed understanding of the importance of proper follow up care.   Clent Demark Saki Legore M.D. Diseases & Surgery of the Retina and Vitreous Retina & Diabetic [place] 03/26/21     Abbreviations: M myopia (nearsighted); A astigmatism; H hyperopia (farsighted); P presbyopia; Mrx spectacle prescription;  CTL contact lenses; OD right eye; OS left eye; OU both eyes  XT exotropia; ET esotropia; PEK punctate epithelial keratitis; PEE punctate epithelial erosions; DES dry eye syndrome; MGD meibomian gland dysfunction; ATs artificial tears; PFAT's preservative free  artificial tears; [place] nuclear sclerotic cataract; PSC posterior subcapsular cataract; ERM epi-retinal membrane; PVD posterior vitreous detachment; RD retinal detachment; DM diabetes mellitus; DR diabetic retinopathy; NPDR non-proliferative diabetic retinopathy; PDR proliferative diabetic retinopathy; CSME clinically significant macular edema; DME diabetic macular edema; dbh dot blot hemorrhages; CWS cotton wool spot; POAG primary open angle glaucoma; C/D cup-to-disc ratio; HVF humphrey visual field; GVF goldmann visual field; OCT optical coherence tomography; IOP intraocular pressure; BRVO Branch retinal vein occlusion; CRVO central retinal vein occlusion; CRAO central retinal artery occlusion; BRAO branch retinal artery occlusion; RT retinal tear; SB scleral buckle; PPV pars plana vitrectomy; VH Vitreous hemorrhage; PRP panretinal laser photocoagulation; IVK intravitreal kenalog; VMT vitreomacular traction; MH Macular hole;  NVD neovascularization of the disc; NVE neovascularization elsewhere; AREDS age related eye disease study; ARMD age related macular degeneration; POAG primary open angle glaucoma; EBMD epithelial/anterior basement membrane dystrophy; ACIOL anterior chamber intraocular lens; IOL intraocular lens; PCIOL posterior chamber intraocular lens; Phaco/IOL phacoemulsification with intraocular lens placement; [place] photorefractive keratectomy; LASIK laser assisted in situ keratomileusis; HTN hypertension; DM diabetes mellitus; COPD chronic obstructive pulmonary disease

## 2021-03-26 NOTE — Assessment & Plan Note (Addendum)
New horseshoe retinal break temporally, centered at -10 meridian laser retinopexy will be delivered today completed without location ?

## 2021-04-02 ENCOUNTER — Encounter (INDEPENDENT_AMBULATORY_CARE_PROVIDER_SITE_OTHER): Payer: PPO | Admitting: Ophthalmology

## 2021-04-03 ENCOUNTER — Other Ambulatory Visit (HOSPITAL_COMMUNITY): Payer: Self-pay | Admitting: Internal Medicine

## 2021-04-03 DIAGNOSIS — M79604 Pain in right leg: Secondary | ICD-10-CM

## 2021-04-04 ENCOUNTER — Other Ambulatory Visit: Payer: Self-pay

## 2021-04-04 ENCOUNTER — Ambulatory Visit (HOSPITAL_COMMUNITY)
Admission: RE | Admit: 2021-04-04 | Discharge: 2021-04-04 | Disposition: A | Discharge status: Home / Self Care | Payer: PPO | Source: Ambulatory Visit | Attending: Internal Medicine | Admitting: Internal Medicine

## 2021-04-04 DIAGNOSIS — M79605 Pain in left leg: Secondary | ICD-10-CM

## 2021-04-04 DIAGNOSIS — M79604 Pain in right leg: Secondary | ICD-10-CM

## 2021-05-07 DIAGNOSIS — R071 Chest pain on breathing: Secondary | ICD-10-CM | POA: Diagnosis not present

## 2021-05-07 DIAGNOSIS — I872 Venous insufficiency (chronic) (peripheral): Secondary | ICD-10-CM | POA: Diagnosis not present

## 2021-05-07 DIAGNOSIS — R0781 Pleurodynia: Secondary | ICD-10-CM | POA: Diagnosis not present

## 2021-05-07 DIAGNOSIS — M810 Age-related osteoporosis without current pathological fracture: Secondary | ICD-10-CM | POA: Diagnosis not present

## 2021-07-26 ENCOUNTER — Encounter (INDEPENDENT_AMBULATORY_CARE_PROVIDER_SITE_OTHER): Payer: PPO | Admitting: Ophthalmology

## 2021-08-07 ENCOUNTER — Encounter (INDEPENDENT_AMBULATORY_CARE_PROVIDER_SITE_OTHER): Payer: PPO | Admitting: Ophthalmology

## 2021-08-08 ENCOUNTER — Ambulatory Visit (INDEPENDENT_AMBULATORY_CARE_PROVIDER_SITE_OTHER): Payer: PPO | Admitting: Ophthalmology

## 2021-08-08 ENCOUNTER — Encounter (INDEPENDENT_AMBULATORY_CARE_PROVIDER_SITE_OTHER): Payer: Self-pay | Admitting: Ophthalmology

## 2021-08-08 DIAGNOSIS — H33311 Horseshoe tear of retina without detachment, right eye: Secondary | ICD-10-CM

## 2021-08-08 DIAGNOSIS — H25042 Posterior subcapsular polar age-related cataract, left eye: Secondary | ICD-10-CM | POA: Diagnosis not present

## 2021-08-08 DIAGNOSIS — H35371 Puckering of macula, right eye: Secondary | ICD-10-CM | POA: Diagnosis not present

## 2021-08-08 DIAGNOSIS — H2513 Age-related nuclear cataract, bilateral: Secondary | ICD-10-CM

## 2021-08-08 DIAGNOSIS — H35372 Puckering of macula, left eye: Secondary | ICD-10-CM

## 2021-08-08 NOTE — Progress Notes (Signed)
08/08/2021     CHIEF COMPLAINT Patient presents for  Chief Complaint  Patient presents with   Retina Follow Up      HISTORY OF PRESENT ILLNESS: BRADY PLANT is a 71 y.o. female who presents to the clinic today for:   HPI     Retina Follow Up           Diagnosis: Other   Laterality: right eye   Severity: moderate   Course: stable         Comments   4 MOS for DILATE OU, COLOR FP. Pt stated vision has been stable since last visit.       Last edited by Silvestre Moment on 08/08/2021  8:26 AM.      Referring physician: Shon Baton, MD [address] [place],  [place] [address]  HISTORICAL INFORMATION:   Selected notes from the [facility]: No current outpatient medications on file. (Ophthalmic Drugs)   No current facility-administered medications for this visit. (Ophthalmic Drugs)   Current Outpatient Medications (Other)  Medication Sig   azithromycin (ZITHROMAX) 500 MG tablet Take 2 tablets (1,000 mg total) by mouth once. 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   No current facility-administered medications for this visit. (Other)      REVIEW OF SYSTEMS: ROS   Negative for: Constitutional, Gastrointestinal, Neurological, Skin, Genitourinary, Musculoskeletal, HENT, Endocrine, Cardiovascular, Eyes, Respiratory, Psychiatric, Allergic/Imm, Heme/Lymph Last edited by Silvestre Moment on 08/08/2021  8:26 AM.       ALLERGIES Allergies  Allergen Reactions   Penicillins Rash    PAST MEDICAL HISTORY History reviewed. No pertinent past medical history. History reviewed. No pertinent surgical history.  FAMILY HISTORY History reviewed. No pertinent family history.  SOCIAL HISTORY Social History   Tobacco Use   Smoking status: Never   Smokeless tobacco: Never  Substance Use Topics   Alcohol use: Yes    Comment: 1 drink/week   Drug use:  No         OPHTHALMIC EXAM:  Base Eye Exam     Visual Acuity (ETDRS)       Right Left   Dist cc 20/40 20/25 -1   Dist ph cc 20/30 -1     Correction: Glasses         Tonometry (Tonopen, 8:31 AM)       Right Left   Pressure 12 10         Pupils       Pupils Dark Light APD   Right PERRL 4 3 None   Left PERRL 4 3 None         Visual Fields       Left Right    Full Full         Extraocular Movement       Right Left    Full Full         Neuro/Psych     Oriented x3: Yes   Mood/Affect: Normal         Dilation     Both eyes: 1.0% Mydriacyl, 2.5% Phenylephrine @ 8:31 AM           Slit Lamp and Fundus Exam     External Exam       Right Left   External Normal Normal  Slit Lamp Exam       Right Left   Lids/Lashes Normal Normal   Conjunctiva/Sclera White and quiet White and quiet   Cornea Clear Clear   Anterior Chamber Deep and quiet Deep and quiet   Iris Round and reactive Round and reactive   Lens 2+ Nuclear sclerosis, Early posterior polar change 2+ Nuclear sclerosis, 1+ Posterior polar cataract   Anterior Vitreous Normal Normal         Fundus Exam       Right Left   Posterior Vitreous Posterior vitreous detachment Posterior vitreous detachment   Disc Normal Normal   C/D Ratio 0.35 0.35   Macula , Epiretinal membrane Epiretinal membrane, no topographic distortion   Vessels Normal Normal   Periphery Lattice degeneration inferiorly, no retinal holes or breaks.  Well treated small HST, horseshoe tear peripherally at 930-10 meridian.  Well treated Lattice degeneration inferiorly, no retinal holes or breaks.  Good retinopexy temporally inferotemporally and inferiorly            IMAGING AND PROCEDURES  Imaging and Procedures for 08/08/21  Color Fundus Photography Optos - OU - Both Eyes       Right Eye Progression has no prior data. Disc findings include normal observations. Macula : normal observations.    Left Eye Progression has no prior data.   Notes Peripheral retinal break or she tear centered at 930-10 meridian peripherally,  good treatment present post laser treatment for retinopexy purposes OD.  Region at 730 and 8 as well as at 6 meridian as well treated in the past  OS , Good laser retinopexy OS inferotemporal and inferiorly, no new breaks              ASSESSMENT/PLAN:  Horseshoe retinal tear of right eye Good laser retinopexy.  No new breaks  Right epiretinal membrane Moderate with thin attenuation of the fovea.  Acuity less than expected given the level of [place] changes.  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  Nuclear sclerotic cataract of both eyes Slight progression of cataract in each eye.  Not likely the cause of vision in the right eye.     ICD-10-CM   1. Horseshoe retinal tear of right eye  H33.311 Color Fundus Photography Optos - OU - Both Eyes    2. Right epiretinal membrane  H35.371     3. Left epiretinal membrane  H35.372     4. 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 Nuclear sclerotic cataract of both eyes  H25.13       1.  OU no new retinal breaks or tears.  Good retinopexy in place  2.  OD with slight engine acuity unexpected due to the degree of cataract being similar to the left eye in fact may be not as much cataract in the right eye.  Thus we will need to reevaluate for the epiretinal membrane and if it is triggering attenuation of the fovea seen in March 2023  3.  Ophthalmic Meds Ordered this visit:  No orders of the defined types were placed in this encounter.      Return in about 8 weeks (around 10/03/2021) for DILATE OU, OCT.  There are no Patient Instructions on file for this visit.   Explained the diagnoses, plan, and follow up with the patient and they expressed  understanding.  Patient expressed understanding of the importance of proper follow up care.   Clent Demark Tahtiana Rozier M.D. Diseases & Surgery of the Retina and Vitreous Retina & Diabetic [place] 08/08/21     Abbreviations: M myopia (nearsighted); A astigmatism; H hyperopia (farsighted); P presbyopia; Mrx spectacle prescription;  CTL contact lenses; OD right eye; OS left eye; OU both eyes  XT exotropia; ET esotropia; PEK punctate epithelial keratitis; PEE punctate epithelial erosions; DES dry eye syndrome; MGD meibomian gland dysfunction; ATs artificial tears; PFAT's preservative free artificial tears; [place] nuclear sclerotic cataract; PSC posterior subcapsular cataract; ERM epi-retinal membrane; PVD posterior vitreous detachment; RD retinal detachment; DM diabetes mellitus; DR diabetic retinopathy; NPDR non-proliferative diabetic retinopathy; PDR proliferative diabetic retinopathy; CSME clinically significant macular edema; DME diabetic macular edema; dbh dot blot hemorrhages; CWS cotton wool spot; POAG primary open angle glaucoma; C/D cup-to-disc ratio; HVF humphrey visual field; GVF goldmann visual field; OCT optical coherence tomography; IOP intraocular pressure; BRVO Branch retinal vein occlusion; CRVO central retinal vein occlusion; CRAO central retinal artery occlusion; BRAO branch retinal artery occlusion; RT retinal tear; SB scleral buckle; PPV pars plana vitrectomy; VH Vitreous hemorrhage; PRP panretinal laser photocoagulation; IVK intravitreal kenalog; VMT vitreomacular traction; MH Macular hole;  NVD neovascularization of the disc; NVE neovascularization elsewhere; AREDS age related eye disease study; ARMD age related macular degeneration; POAG primary open angle glaucoma; EBMD epithelial/anterior basement membrane dystrophy; ACIOL anterior chamber intraocular lens; IOL intraocular lens; PCIOL posterior chamber intraocular lens; Phaco/IOL phacoemulsification with intraocular lens placement; [place]  photorefractive keratectomy; LASIK laser assisted in situ keratomileusis; HTN hypertension; DM diabetes mellitus; COPD chronic obstructive pulmonary disease

## 2021-08-08 NOTE — Assessment & Plan Note (Signed)
Moderate with thin attenuation of the fovea.  Acuity less than expected given the level of [place] changes.  May need repeat OCT in the future

## 2021-08-08 NOTE — Assessment & Plan Note (Signed)
Minor OS continue to monitor

## 2021-08-08 NOTE — Assessment & Plan Note (Signed)
Slight progression of cataract in each eye.  Not likely the cause of vision in the right eye.

## 2021-08-08 NOTE — Assessment & Plan Note (Signed)
Good laser retinopexy.  No new breaks

## 2021-08-08 NOTE — Assessment & Plan Note (Signed)
Stable OS 

## 2021-08-10 ENCOUNTER — Other Ambulatory Visit: Payer: Self-pay | Admitting: Internal Medicine

## 2021-08-10 DIAGNOSIS — Z1231 Encounter for screening mammogram for malignant neoplasm of breast: Secondary | ICD-10-CM

## 2021-09-11 ENCOUNTER — Ambulatory Visit
Admission: RE | Admit: 2021-09-11 | Discharge: 2021-09-11 | Disposition: A | Discharge status: Home / Self Care | Payer: PPO | Source: Ambulatory Visit | Attending: Internal Medicine | Admitting: Internal Medicine

## 2021-09-11 DIAGNOSIS — Z1231 Encounter for screening mammogram for malignant neoplasm of breast: Secondary | ICD-10-CM

## 2021-10-03 ENCOUNTER — Encounter (INDEPENDENT_AMBULATORY_CARE_PROVIDER_SITE_OTHER): Payer: PPO | Admitting: Ophthalmology

## 2021-10-08 ENCOUNTER — Ambulatory Visit (INDEPENDENT_AMBULATORY_CARE_PROVIDER_SITE_OTHER): Payer: PPO | Admitting: Ophthalmology

## 2021-10-08 ENCOUNTER — Encounter (INDEPENDENT_AMBULATORY_CARE_PROVIDER_SITE_OTHER): Payer: Self-pay | Admitting: Ophthalmology

## 2021-10-08 DIAGNOSIS — H33311 Horseshoe tear of retina without detachment, right eye: Secondary | ICD-10-CM

## 2021-10-08 DIAGNOSIS — H2513 Age-related nuclear cataract, bilateral: Secondary | ICD-10-CM | POA: Diagnosis not present

## 2021-10-08 DIAGNOSIS — H35413 Lattice degeneration of retina, bilateral: Secondary | ICD-10-CM | POA: Insufficient documentation

## 2021-10-08 DIAGNOSIS — H35371 Puckering of macula, right eye: Secondary | ICD-10-CM

## 2021-10-08 DIAGNOSIS — H25042 Posterior subcapsular polar age-related cataract, left eye: Secondary | ICD-10-CM

## 2021-10-08 NOTE — Patient Instructions (Signed)
Patient instructed to notify the office promptly new onset visual decline distortions flashes of light or new onset floaters or shades of darkness either eye

## 2021-10-08 NOTE — Assessment & Plan Note (Signed)
Stable over time no change near the visual axis

## 2021-10-08 NOTE — Progress Notes (Signed)
10/08/2021     CHIEF COMPLAINT Patient presents for  Chief Complaint  Patient presents with   Retina Follow Up      HISTORY OF PRESENT ILLNESS: Lisa Beltran is a 71 y.o. female who presents to the clinic today for:   HPI     Retina Follow Up           Diagnosis: Other   Laterality: right eye   Severity: moderate   Course: stable         Comments   8 weeks for DILATE OU, OCT. Pt stated, "At night and I have my eyes closed, I noticed a flash of light. It has been going on for about 3 months and has happened multiple occurences." Pt reports no vision changes.         Last edited by Silvestre Moment on 10/08/2021  8:20 AM.      Referring physician: Debbra Riding, MD [address] [place],  [place] [address]  HISTORICAL INFORMATION:   Selected notes from the MEDICAL RECORD NUMBER       CURRENT MEDICATIONS: No current outpatient medications on file. (Ophthalmic Drugs)   No current facility-administered medications for this visit. (Ophthalmic Drugs)   Current Outpatient Medications (Other)  Medication Sig   azithromycin (ZITHROMAX) 500 MG tablet Take 2 tablets (1,000 mg total) by mouth once. 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   No current facility-administered medications for this visit. (Other)      REVIEW OF SYSTEMS: ROS   Negative for: Constitutional, Gastrointestinal, Neurological, Skin, Genitourinary, Musculoskeletal, HENT, Endocrine, Cardiovascular, Eyes, Respiratory, Psychiatric, Allergic/Imm, Heme/Lymph Last edited by Silvestre Moment on 10/08/2021  8:20 AM.       ALLERGIES Allergies  Allergen Reactions   Penicillins Rash    PAST MEDICAL HISTORY History reviewed. No pertinent past medical history. History reviewed. No pertinent surgical history.  FAMILY HISTORY History reviewed. No pertinent family history.  SOCIAL HISTORY Social History    Tobacco Use   Smoking status: Never   Smokeless tobacco: Never  Substance Use Topics   Alcohol use: Yes    Comment: 1 drink/week   Drug use: No         OPHTHALMIC EXAM:  Base Eye Exam     Visual Acuity (ETDRS)       Right Left   Dist cc 20/30 -2 20/20 -2   Dist ph cc 20/25 -1     Correction: Glasses         Tonometry (Tonopen, 8:24 AM)       Right Left   Pressure 14 14         Pupils       Pupils APD   Right PERRL None   Left PERRL None         Visual Fields       Left Right    Full Full         Extraocular Movement       Right Left    Full, Ortho Full, Ortho         Neuro/Psych     Oriented x3: Yes   Mood/Affect: Normal         Dilation     Both eyes: 1.0% Mydriacyl, 2.5% Phenylephrine @ 8:24 AM           Slit Lamp and  Fundus Exam     External Exam       Right Left   External Normal Normal         Slit Lamp Exam       Right Left   Lids/Lashes Normal Normal   Conjunctiva/Sclera White and quiet White and quiet   Cornea Clear Clear   Anterior Chamber Deep and quiet Deep and quiet   Iris Round and reactive Round and reactive   Lens 2+ Nuclear sclerosis, Early posterior polar change 2+ Nuclear sclerosis, 1+ Posterior polar cataract   Anterior Vitreous Normal Normal         Fundus Exam       Right Left   Posterior Vitreous Posterior vitreous detachment Posterior vitreous detachment   Disc Normal Normal   C/D Ratio 0.35 0.35   Macula , Epiretinal membrane Epiretinal membrane, no topographic distortion   Vessels Normal Normal   Periphery Lattice degeneration inferiorly, no retinal holes or breaks.  Well treated small HST, horseshoe tear peripherally at 930-10 meridian.  Well treated Lattice degeneration inferiorly, no retinal holes or breaks.  Good retinopexy temporally inferotemporally and inferiorly            IMAGING AND PROCEDURES  Imaging and Procedures for 10/08/21  OCT, Retina - OU - Both Eyes        Right Eye Quality was good. Scan locations included subfoveal. Central Foveal Thickness: 330. Progression has improved. Findings include abnormal foveal contour, epiretinal membrane.   Left Eye Quality was good. Scan locations included subfoveal. Central Foveal Thickness: 337. Progression has improved. Findings include abnormal foveal contour, cystoid macular edema, epiretinal membrane.   Notes No topographic distortion to the macula left eye from mild vitreal macular traction and epiretinal membrane.  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 I explained that in rare cases another surgery is needed to actually remove a second wrinkle should it regrow.  Most often, the epiretinal membrane and underlying wrinkled internal limiting membrane are removed with the first surgery, to accomplish the goals.   If the operative eye is Phakic (natural lens still present), cataract surgery is often recommended prior to Vitrectomy. This will enable the retina surgeon to have the best view during surgery and the patient to obtain optimal results in the future. Treatment options were discussed.  I have recommended at home monitoring the near vision task in a monocular (1 eye at a time), with or without near vision glasses, to look for changes or declines in reading.  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 The patient was reassured that surgery at a later date does not put the patient at risk for a worse outcome. It was emphasized that the need for surgery is dictated by the  patient's quality of life as influenced by the cataract. Patient was instructed to maintain close follow up with their general eye care doctor.  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 Horseshoe retinal tear of right eye  H33.311 OCT, Retina - OU - Both Eyes    2. Right epiretinal membrane  H35.371     3. 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 Nuclear sclerotic cataract of both eyes  H25.13     5. Lattice degeneration of both retinas  H35.413       1.  OU doing well.  History of retinal tears OU lattice degeneration in each eye now with no new retinal breaks.  Stable  2.  Follow-up Dr. Katy Fitch of [facility] for [facility] and PSC changes  3.  Epiretinal membrane OD no progression over time  Ophthalmic Meds Ordered this visit:  No orders of the defined types were placed in this encounter.      Return in about 1 year (around 10/09/2022) for DILATE OU, OCT, COLOR FP.  Patient Instructions  Patient instructed to notify the office promptly new onset visual decline distortions flashes of light or new onset floaters or shades of darkness either eye   Explained the diagnoses, plan, and follow up with the patient and they expressed understanding.  Patient expressed understanding of the importance of proper follow up care.   Clent Demark Nakoma Gotwalt M.D. Diseases & Surgery of the Retina and Vitreous [facility] [place] 10/08/21     Abbreviations: M myopia (nearsighted); A astigmatism; H hyperopia (farsighted); P presbyopia; Mrx spectacle prescription;  CTL contact lenses; OD right eye; OS left eye; OU both eyes  XT exotropia; ET esotropia; PEK punctate epithelial keratitis; PEE punctate epithelial erosions; DES dry eye syndrome; MGD meibomian gland dysfunction; ATs artificial tears; PFAT's preservative free artificial tears; [place] nuclear  sclerotic cataract; PSC posterior subcapsular cataract; ERM epi-retinal membrane; PVD posterior vitreous detachment; RD retinal detachment; DM diabetes mellitus; DR diabetic retinopathy; NPDR non-proliferative diabetic retinopathy; PDR proliferative diabetic retinopathy; CSME clinically significant macular edema; DME diabetic macular edema; dbh dot blot hemorrhages; CWS cotton wool spot; POAG primary open angle glaucoma; C/D cup-to-disc ratio; HVF humphrey visual field; GVF goldmann visual field; OCT optical coherence tomography; IOP intraocular pressure; BRVO Branch retinal vein occlusion; CRVO central retinal vein occlusion; CRAO central retinal artery occlusion; BRAO branch retinal artery occlusion; RT retinal tear; SB scleral buckle; PPV pars plana vitrectomy; VH Vitreous hemorrhage; PRP panretinal laser photocoagulation; IVK intravitreal kenalog; VMT vitreomacular traction; MH Macular hole;  NVD neovascularization of the disc; NVE neovascularization elsewhere; AREDS age related eye disease study; ARMD age related macular degeneration; POAG primary open angle glaucoma; EBMD epithelial/anterior basement membrane dystrophy; ACIOL anterior chamber intraocular lens; IOL intraocular lens; PCIOL posterior chamber intraocular lens; Phaco/IOL phacoemulsification with intraocular lens placement; [place] photorefractive keratectomy; LASIK laser assisted in situ keratomileusis; HTN hypertension; DM diabetes mellitus; COPD chronic obstructive pulmonary disease

## 2021-10-08 NOTE — Assessment & Plan Note (Signed)
History of lattice OU good laser retinopexy is each eye is stable over time no new breaks

## 2021-10-08 NOTE — Assessment & Plan Note (Signed)
The nature of macular pucker (epiretinal membrane ERM) was discussed with the patient as well as threshold criteria for vitrectomy surgery. I explained that in rare cases another surgery is needed to actually remove a second wrinkle should it regrow.  Most often, the epiretinal membrane and underlying wrinkled internal limiting membrane are removed with the first surgery, to accomplish the goals.   If the operative eye is Phakic (natural lens still present), cataract surgery is often recommended prior to Vitrectomy. This will enable the retina surgeon to have the best view during surgery and the patient to obtain optimal results in the future. Treatment options were discussed.  I have recommended at home monitoring the near vision task in a monocular (1 eye at a time), with or without near vision glasses, to look for changes or declines in reading.

## 2021-10-08 NOTE — Assessment & Plan Note (Signed)

## 2021-10-16 ENCOUNTER — Encounter (INDEPENDENT_AMBULATORY_CARE_PROVIDER_SITE_OTHER): Payer: PPO | Admitting: Ophthalmology

## 2021-10-30 DIAGNOSIS — L570 Actinic keratosis: Secondary | ICD-10-CM | POA: Diagnosis not present

## 2021-10-30 DIAGNOSIS — C44319 Basal cell carcinoma of skin of other parts of face: Secondary | ICD-10-CM | POA: Diagnosis not present

## 2021-10-30 DIAGNOSIS — D225 Melanocytic nevi of trunk: Secondary | ICD-10-CM | POA: Diagnosis not present

## 2021-10-30 DIAGNOSIS — Z85828 Personal history of other malignant neoplasm of skin: Secondary | ICD-10-CM | POA: Diagnosis not present

## 2021-10-30 DIAGNOSIS — D485 Neoplasm of uncertain behavior of skin: Secondary | ICD-10-CM | POA: Diagnosis not present

## 2021-10-30 DIAGNOSIS — L814 Other melanin hyperpigmentation: Secondary | ICD-10-CM | POA: Diagnosis not present

## 2021-10-30 DIAGNOSIS — L578 Other skin changes due to chronic exposure to nonionizing radiation: Secondary | ICD-10-CM | POA: Diagnosis not present

## 2021-10-30 DIAGNOSIS — L821 Other seborrheic keratosis: Secondary | ICD-10-CM | POA: Diagnosis not present

## 2021-11-01 DIAGNOSIS — E039 Hypothyroidism, unspecified: Secondary | ICD-10-CM | POA: Diagnosis not present

## 2021-11-01 DIAGNOSIS — M810 Age-related osteoporosis without current pathological fracture: Secondary | ICD-10-CM | POA: Diagnosis not present

## 2021-11-01 DIAGNOSIS — R7989 Other specified abnormal findings of blood chemistry: Secondary | ICD-10-CM | POA: Diagnosis not present

## 2021-11-01 DIAGNOSIS — E785 Hyperlipidemia, unspecified: Secondary | ICD-10-CM | POA: Diagnosis not present

## 2021-11-08 DIAGNOSIS — Z1331 Encounter for screening for depression: Secondary | ICD-10-CM | POA: Diagnosis not present

## 2021-11-08 DIAGNOSIS — Z0000 Encounter for general adult medical examination without abnormal findings: Secondary | ICD-10-CM | POA: Diagnosis not present

## 2021-11-08 DIAGNOSIS — Z1389 Encounter for screening for other disorder: Secondary | ICD-10-CM | POA: Diagnosis not present

## 2021-11-08 DIAGNOSIS — K5900 Constipation, unspecified: Secondary | ICD-10-CM | POA: Diagnosis not present

## 2021-11-08 DIAGNOSIS — R636 Underweight: Secondary | ICD-10-CM | POA: Diagnosis not present

## 2021-11-08 DIAGNOSIS — R3121 Asymptomatic microscopic hematuria: Secondary | ICD-10-CM | POA: Diagnosis not present

## 2021-11-08 DIAGNOSIS — R82998 Other abnormal findings in urine: Secondary | ICD-10-CM | POA: Diagnosis not present

## 2021-11-08 DIAGNOSIS — M810 Age-related osteoporosis without current pathological fracture: Secondary | ICD-10-CM | POA: Diagnosis not present

## 2021-11-08 DIAGNOSIS — E039 Hypothyroidism, unspecified: Secondary | ICD-10-CM | POA: Diagnosis not present

## 2021-11-08 DIAGNOSIS — E785 Hyperlipidemia, unspecified: Secondary | ICD-10-CM | POA: Diagnosis not present

## 2021-11-08 DIAGNOSIS — J309 Allergic rhinitis, unspecified: Secondary | ICD-10-CM | POA: Diagnosis not present

## 2021-11-08 DIAGNOSIS — Z23 Encounter for immunization: Secondary | ICD-10-CM | POA: Diagnosis not present

## 2021-11-13 DIAGNOSIS — C44319 Basal cell carcinoma of skin of other parts of face: Secondary | ICD-10-CM | POA: Diagnosis not present

## 2022-01-24 DIAGNOSIS — Z5189 Encounter for other specified aftercare: Secondary | ICD-10-CM | POA: Diagnosis not present

## 2022-01-24 DIAGNOSIS — Z85828 Personal history of other malignant neoplasm of skin: Secondary | ICD-10-CM | POA: Diagnosis not present

## 2022-04-10 IMAGING — MG MM DIGITAL SCREENING BILAT W/ TOMO AND CAD
8 series · 9 of 24 positions shown · non-contrast
Comparison: Previous exam(s).

CLINICAL DATA: Screening.

EXAM:
DIGITAL SCREENING BILATERAL MAMMOGRAM WITH TOMOSYNTHESIS AND CAD
TECHNIQUE: Bilateral screening digital craniocaudal and mediolateral oblique
mammograms were obtained. Bilateral screening digital breast
tomosynthesis was performed. The images were evaluated with
computer-aided detection.

[L MLO synth-2D]
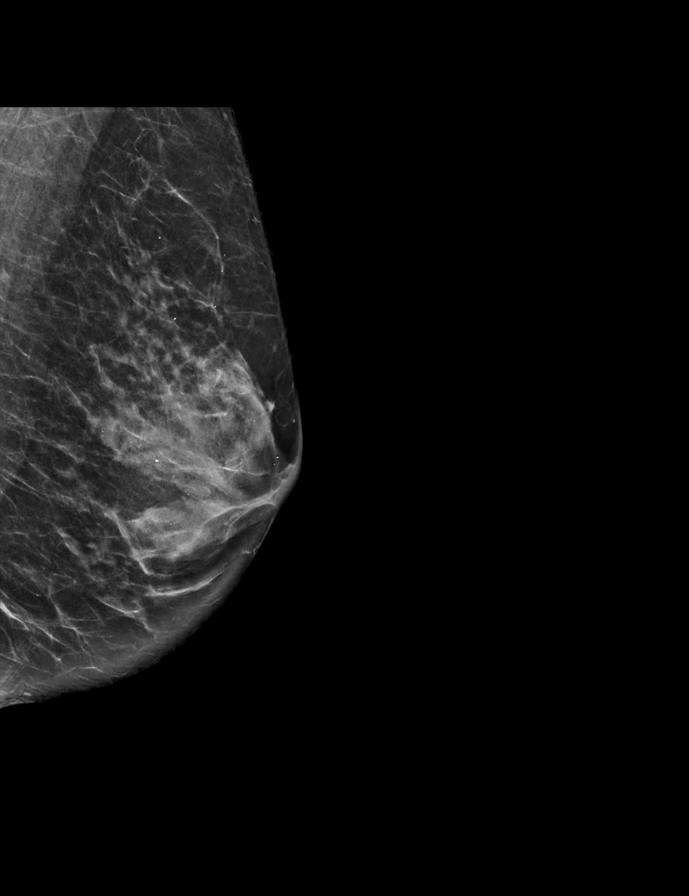

[R MLO synth-2D]
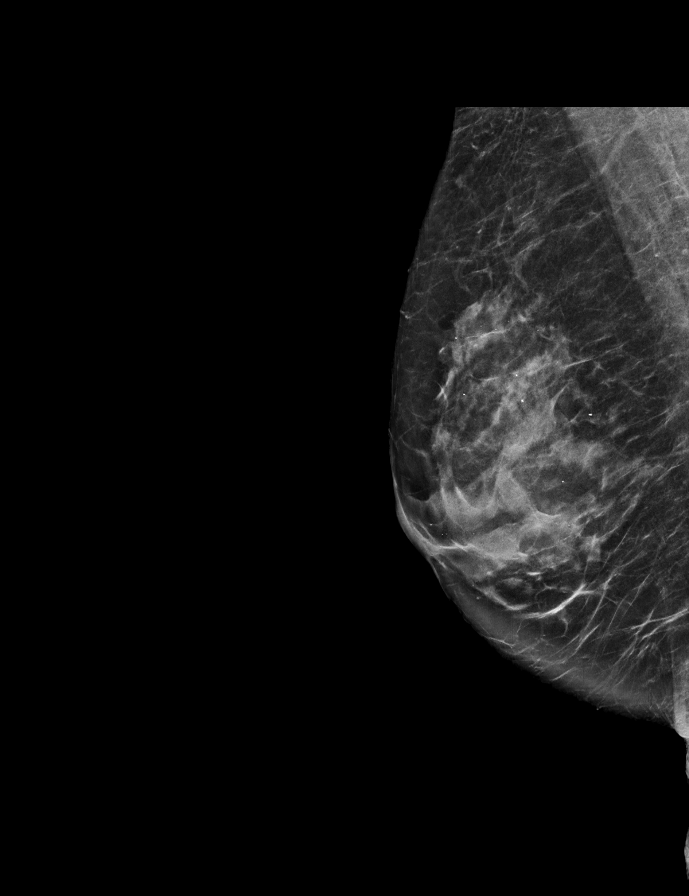

[R CC synth-2D]
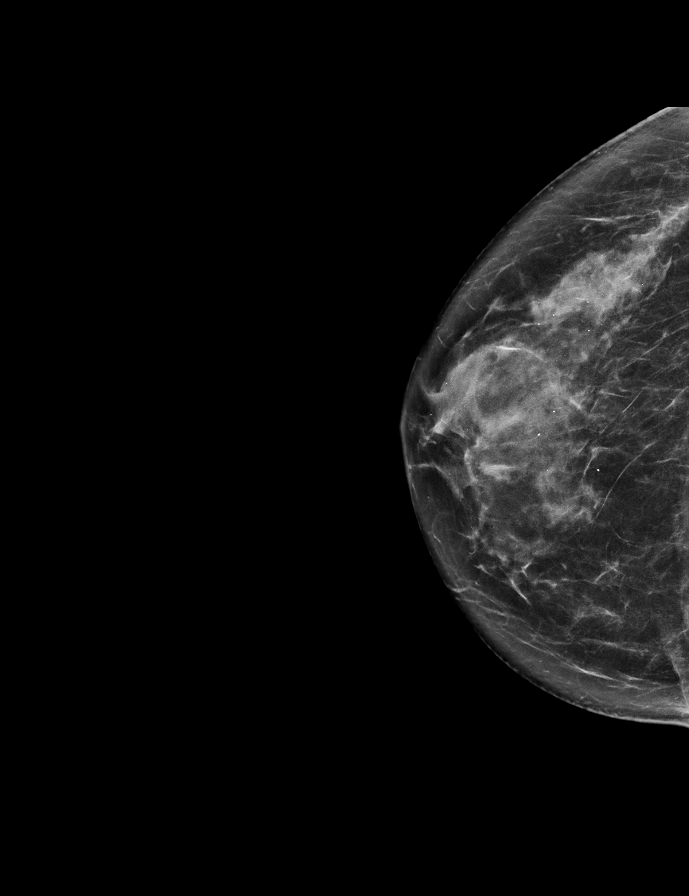

[L CC synth-2D]
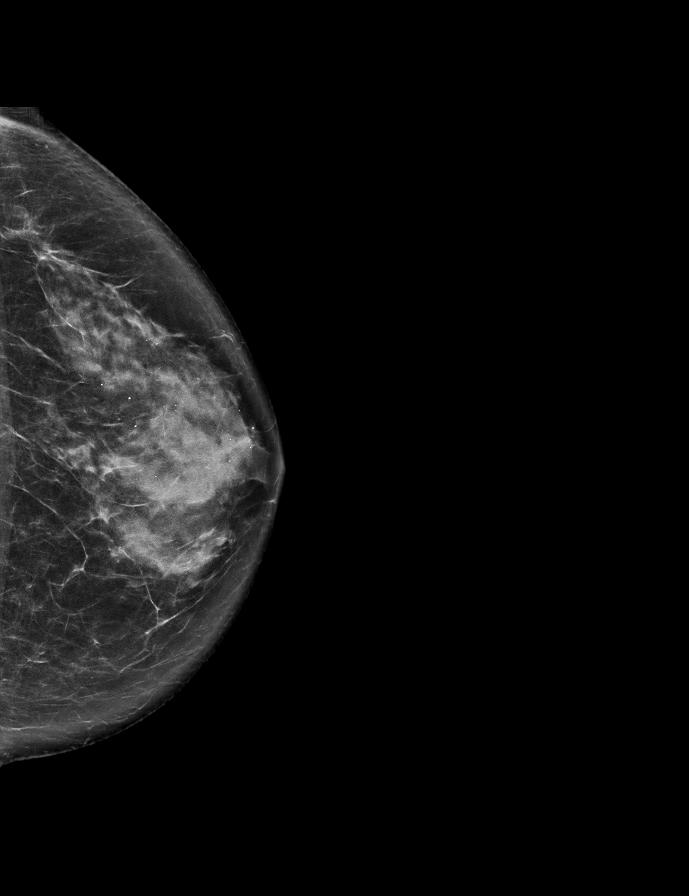

[R CC tomo · 2 of 62 frames shown]
[frame 21/62]
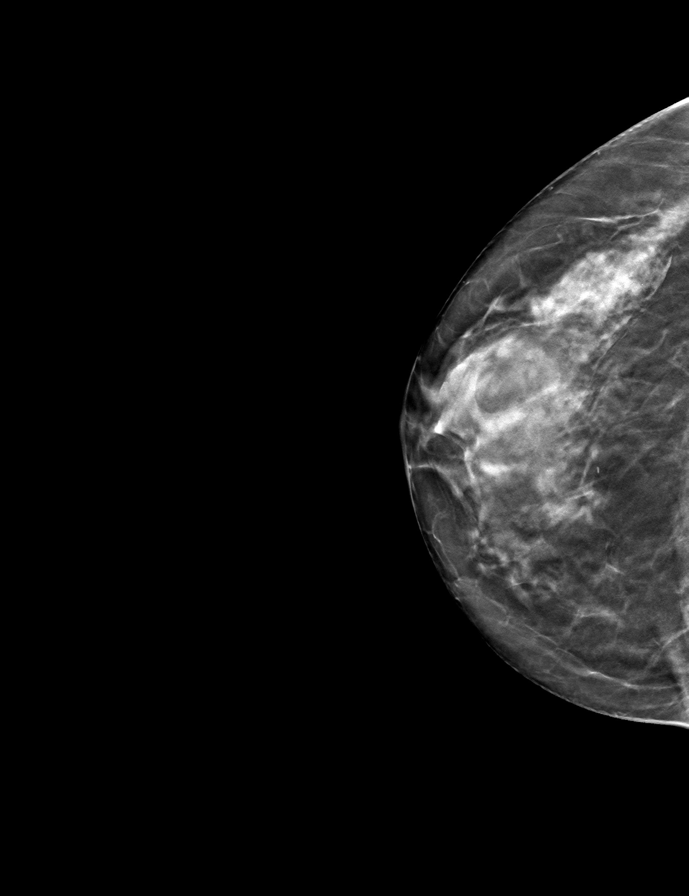
[frame 31/62]
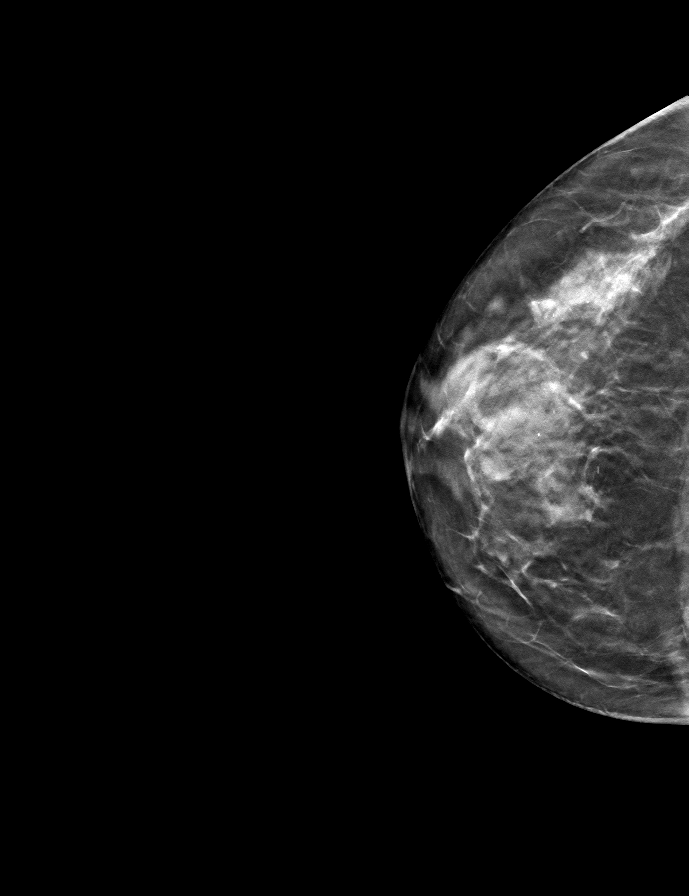

[R MLO tomo · tomo slice 33/64.0]
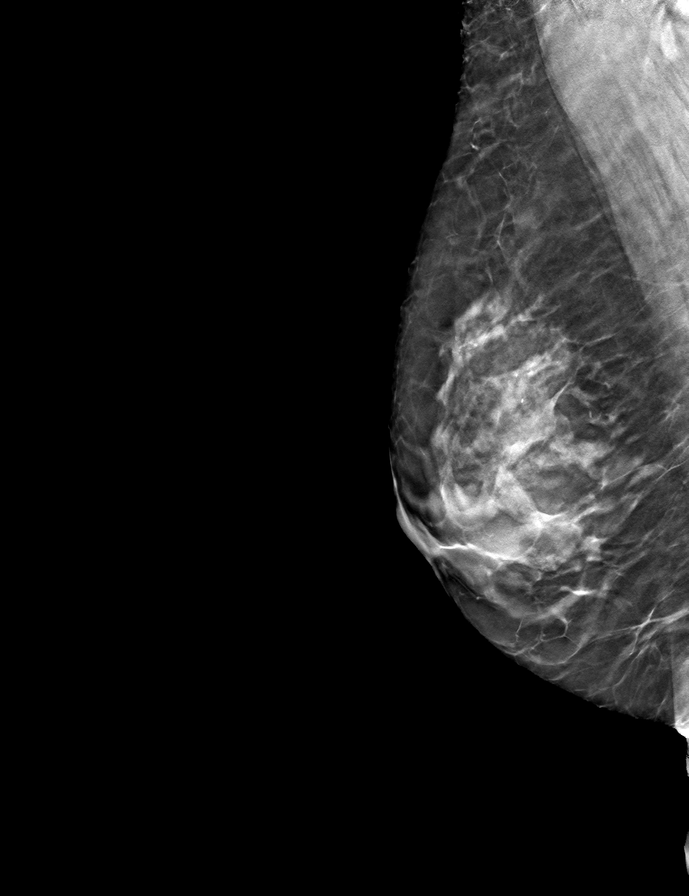

[L MLO tomo · tomo slice 29/57.0]
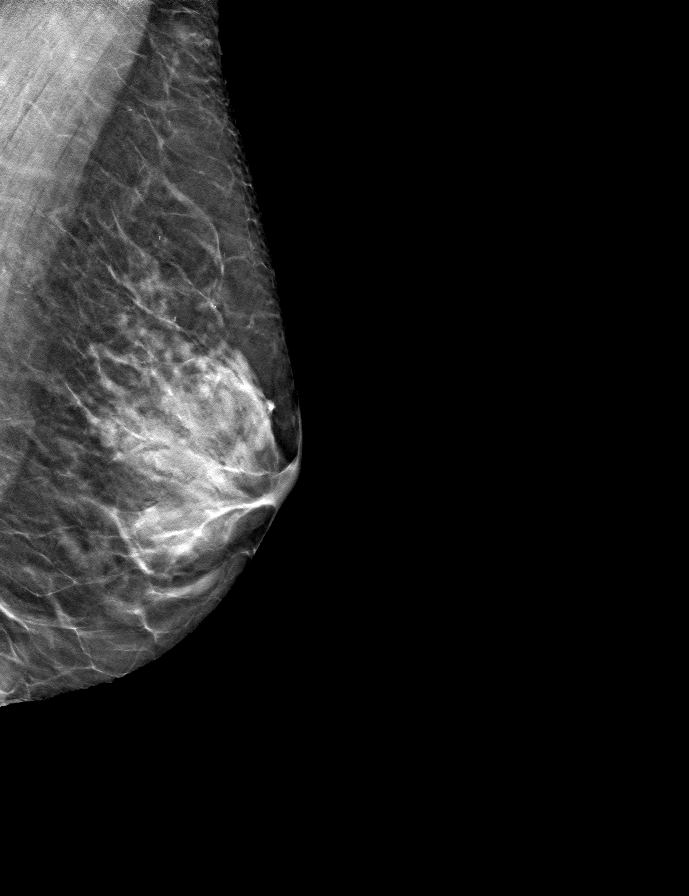

[L CC tomo · tomo slice 34/67.0]
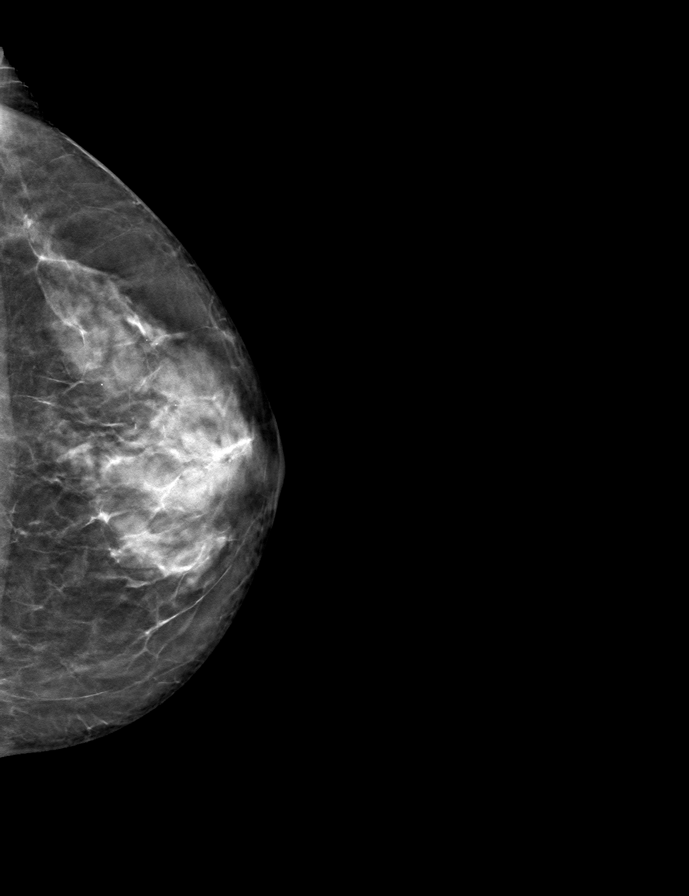

[9 of 24 positions shown; findings below may reference images not displayed]

ACR Breast Density Category c: The breast tissue is heterogeneously
dense, which may obscure small masses.
FINDINGS: There are no findings suspicious for malignancy.
IMPRESSION: No mammographic evidence of malignancy. A result letter of this
screening mammogram will be mailed directly to the patient.

RECOMMENDATION:
Screening mammogram in one year. (Code:[id number])

BI-RADS CATEGORY  1: Negative.

## 2022-05-14 DIAGNOSIS — D122 Benign neoplasm of ascending colon: Secondary | ICD-10-CM | POA: Diagnosis not present

## 2022-05-14 DIAGNOSIS — K648 Other hemorrhoids: Secondary | ICD-10-CM | POA: Diagnosis not present

## 2022-05-14 DIAGNOSIS — Z1211 Encounter for screening for malignant neoplasm of colon: Secondary | ICD-10-CM | POA: Diagnosis not present

## 2022-05-14 DIAGNOSIS — K5730 Diverticulosis of large intestine without perforation or abscess without bleeding: Secondary | ICD-10-CM | POA: Diagnosis not present

## 2022-05-15 DIAGNOSIS — H25813 Combined forms of age-related cataract, bilateral: Secondary | ICD-10-CM | POA: Diagnosis not present

## 2022-05-15 DIAGNOSIS — H33301 Unspecified retinal break, right eye: Secondary | ICD-10-CM | POA: Diagnosis not present

## 2022-06-12 DIAGNOSIS — Z01419 Encounter for gynecological examination (general) (routine) without abnormal findings: Secondary | ICD-10-CM | POA: Diagnosis not present

## 2022-06-12 DIAGNOSIS — Z124 Encounter for screening for malignant neoplasm of cervix: Secondary | ICD-10-CM | POA: Diagnosis not present

## 2022-06-12 DIAGNOSIS — Z01411 Encounter for gynecological examination (general) (routine) with abnormal findings: Secondary | ICD-10-CM | POA: Diagnosis not present

## 2022-06-12 DIAGNOSIS — Z681 Body mass index (BMI) 19 or less, adult: Secondary | ICD-10-CM | POA: Diagnosis not present

## 2022-06-12 DIAGNOSIS — Z90721 Acquired absence of ovaries, unilateral: Secondary | ICD-10-CM | POA: Diagnosis not present

## 2022-06-12 DIAGNOSIS — R1031 Right lower quadrant pain: Secondary | ICD-10-CM | POA: Diagnosis not present

## 2022-07-02 DIAGNOSIS — R1031 Right lower quadrant pain: Secondary | ICD-10-CM | POA: Diagnosis not present

## 2022-08-19 ENCOUNTER — Other Ambulatory Visit: Payer: Self-pay | Admitting: Internal Medicine

## 2022-08-19 DIAGNOSIS — Z1231 Encounter for screening mammogram for malignant neoplasm of breast: Secondary | ICD-10-CM

## 2022-09-16 ENCOUNTER — Ambulatory Visit
Admission: RE | Admit: 2022-09-16 | Discharge: 2022-09-16 | Disposition: A | Discharge status: Home / Self Care | Payer: PPO | Source: Ambulatory Visit | Attending: Internal Medicine | Admitting: Internal Medicine

## 2022-09-16 DIAGNOSIS — Z1231 Encounter for screening mammogram for malignant neoplasm of breast: Secondary | ICD-10-CM

## 2022-10-09 ENCOUNTER — Encounter (INDEPENDENT_AMBULATORY_CARE_PROVIDER_SITE_OTHER): Payer: PPO | Admitting: Ophthalmology

## 2022-10-30 DIAGNOSIS — H40013 Open angle with borderline findings, low risk, bilateral: Secondary | ICD-10-CM | POA: Diagnosis not present

## 2022-10-30 DIAGNOSIS — H35373 Puckering of macula, bilateral: Secondary | ICD-10-CM | POA: Diagnosis not present

## 2022-10-30 DIAGNOSIS — H33331 Multiple defects of retina without detachment, right eye: Secondary | ICD-10-CM | POA: Diagnosis not present

## 2022-10-30 DIAGNOSIS — H33312 Horseshoe tear of retina without detachment, left eye: Secondary | ICD-10-CM | POA: Diagnosis not present

## 2022-10-30 DIAGNOSIS — H2513 Age-related nuclear cataract, bilateral: Secondary | ICD-10-CM | POA: Diagnosis not present

## 2022-11-08 DIAGNOSIS — E039 Hypothyroidism, unspecified: Secondary | ICD-10-CM | POA: Diagnosis not present

## 2022-11-08 DIAGNOSIS — E785 Hyperlipidemia, unspecified: Secondary | ICD-10-CM | POA: Diagnosis not present

## 2022-11-08 DIAGNOSIS — Z1389 Encounter for screening for other disorder: Secondary | ICD-10-CM | POA: Diagnosis not present

## 2022-11-08 DIAGNOSIS — M810 Age-related osteoporosis without current pathological fracture: Secondary | ICD-10-CM | POA: Diagnosis not present

## 2022-11-08 DIAGNOSIS — Z1212 Encounter for screening for malignant neoplasm of rectum: Secondary | ICD-10-CM | POA: Diagnosis not present

## 2022-11-14 DIAGNOSIS — K5900 Constipation, unspecified: Secondary | ICD-10-CM | POA: Diagnosis not present

## 2022-11-14 DIAGNOSIS — Z1339 Encounter for screening examination for other mental health and behavioral disorders: Secondary | ICD-10-CM | POA: Diagnosis not present

## 2022-11-14 DIAGNOSIS — J309 Allergic rhinitis, unspecified: Secondary | ICD-10-CM | POA: Diagnosis not present

## 2022-11-14 DIAGNOSIS — E785 Hyperlipidemia, unspecified: Secondary | ICD-10-CM | POA: Diagnosis not present

## 2022-11-14 DIAGNOSIS — R636 Underweight: Secondary | ICD-10-CM | POA: Diagnosis not present

## 2022-11-14 DIAGNOSIS — Z0000 Encounter for general adult medical examination without abnormal findings: Secondary | ICD-10-CM | POA: Diagnosis not present

## 2022-11-14 DIAGNOSIS — R82998 Other abnormal findings in urine: Secondary | ICD-10-CM | POA: Diagnosis not present

## 2022-11-14 DIAGNOSIS — I868 Varicose veins of other specified sites: Secondary | ICD-10-CM | POA: Diagnosis not present

## 2022-11-14 DIAGNOSIS — E039 Hypothyroidism, unspecified: Secondary | ICD-10-CM | POA: Diagnosis not present

## 2022-11-14 DIAGNOSIS — Z1331 Encounter for screening for depression: Secondary | ICD-10-CM | POA: Diagnosis not present

## 2022-11-14 DIAGNOSIS — M810 Age-related osteoporosis without current pathological fracture: Secondary | ICD-10-CM | POA: Diagnosis not present

## 2022-11-18 DIAGNOSIS — L578 Other skin changes due to chronic exposure to nonionizing radiation: Secondary | ICD-10-CM | POA: Diagnosis not present

## 2022-11-18 DIAGNOSIS — L821 Other seborrheic keratosis: Secondary | ICD-10-CM | POA: Diagnosis not present

## 2022-11-18 DIAGNOSIS — D225 Melanocytic nevi of trunk: Secondary | ICD-10-CM | POA: Diagnosis not present

## 2022-11-18 DIAGNOSIS — L814 Other melanin hyperpigmentation: Secondary | ICD-10-CM | POA: Diagnosis not present

## 2022-11-18 DIAGNOSIS — L820 Inflamed seborrheic keratosis: Secondary | ICD-10-CM | POA: Diagnosis not present

## 2022-11-18 DIAGNOSIS — Z85828 Personal history of other malignant neoplasm of skin: Secondary | ICD-10-CM | POA: Diagnosis not present

## 2022-11-18 DIAGNOSIS — B078 Other viral warts: Secondary | ICD-10-CM | POA: Diagnosis not present

## 2022-11-18 DIAGNOSIS — L905 Scar conditions and fibrosis of skin: Secondary | ICD-10-CM | POA: Diagnosis not present

## 2022-11-18 DIAGNOSIS — L570 Actinic keratosis: Secondary | ICD-10-CM | POA: Diagnosis not present

## 2022-12-23 DIAGNOSIS — L905 Scar conditions and fibrosis of skin: Secondary | ICD-10-CM | POA: Diagnosis not present

## 2022-12-24 DIAGNOSIS — H35372 Puckering of macula, left eye: Secondary | ICD-10-CM | POA: Diagnosis not present

## 2022-12-24 DIAGNOSIS — H33301 Unspecified retinal break, right eye: Secondary | ICD-10-CM | POA: Diagnosis not present

## 2022-12-24 DIAGNOSIS — H25813 Combined forms of age-related cataract, bilateral: Secondary | ICD-10-CM | POA: Diagnosis not present

## 2022-12-24 DIAGNOSIS — H04123 Dry eye syndrome of bilateral lacrimal glands: Secondary | ICD-10-CM | POA: Diagnosis not present

## 2023-03-11 DIAGNOSIS — L729 Follicular cyst of the skin and subcutaneous tissue, unspecified: Secondary | ICD-10-CM | POA: Diagnosis not present

## 2023-03-25 DIAGNOSIS — H25812 Combined forms of age-related cataract, left eye: Secondary | ICD-10-CM | POA: Diagnosis not present

## 2023-03-25 DIAGNOSIS — H52222 Regular astigmatism, left eye: Secondary | ICD-10-CM | POA: Diagnosis not present

## 2023-04-03 DIAGNOSIS — H2511 Age-related nuclear cataract, right eye: Secondary | ICD-10-CM | POA: Diagnosis not present

## 2023-04-07 DIAGNOSIS — Z5189 Encounter for other specified aftercare: Secondary | ICD-10-CM | POA: Diagnosis not present

## 2023-04-08 DIAGNOSIS — H25811 Combined forms of age-related cataract, right eye: Secondary | ICD-10-CM | POA: Diagnosis not present

## 2023-05-12 DIAGNOSIS — D369 Benign neoplasm, unspecified site: Secondary | ICD-10-CM | POA: Diagnosis not present

## 2023-05-12 DIAGNOSIS — D485 Neoplasm of uncertain behavior of skin: Secondary | ICD-10-CM | POA: Diagnosis not present

## 2023-05-12 DIAGNOSIS — L814 Other melanin hyperpigmentation: Secondary | ICD-10-CM | POA: Diagnosis not present

## 2023-07-27 ENCOUNTER — Emergency Department (HOSPITAL_COMMUNITY)
Admission: EM | Admit: 2023-07-27 | Discharge: 2023-07-27 | Disposition: A | Discharge status: Home / Self Care | Attending: Emergency Medicine | Admitting: Emergency Medicine

## 2023-07-27 ENCOUNTER — Emergency Department (HOSPITAL_COMMUNITY)

## 2023-07-27 ENCOUNTER — Encounter (HOSPITAL_COMMUNITY): Payer: Self-pay

## 2023-07-27 ENCOUNTER — Other Ambulatory Visit: Payer: Self-pay

## 2023-07-27 DIAGNOSIS — S0231XA Fracture of orbital floor, right side, initial encounter for closed fracture: Secondary | ICD-10-CM | POA: Diagnosis not present

## 2023-07-27 DIAGNOSIS — R519 Headache, unspecified: Secondary | ICD-10-CM | POA: Diagnosis not present

## 2023-07-27 DIAGNOSIS — R413 Other amnesia: Secondary | ICD-10-CM | POA: Diagnosis not present

## 2023-07-27 DIAGNOSIS — I10 Essential (primary) hypertension: Secondary | ICD-10-CM | POA: Diagnosis not present

## 2023-07-27 DIAGNOSIS — J3489 Other specified disorders of nose and nasal sinuses: Secondary | ICD-10-CM | POA: Diagnosis not present

## 2023-07-27 DIAGNOSIS — D72829 Elevated white blood cell count, unspecified: Secondary | ICD-10-CM | POA: Insufficient documentation

## 2023-07-27 DIAGNOSIS — R9431 Abnormal electrocardiogram [ECG] [EKG]: Secondary | ICD-10-CM | POA: Diagnosis not present

## 2023-07-27 DIAGNOSIS — R4182 Altered mental status, unspecified: Secondary | ICD-10-CM | POA: Diagnosis not present

## 2023-07-27 DIAGNOSIS — R29818 Other symptoms and signs involving the nervous system: Secondary | ICD-10-CM | POA: Diagnosis not present

## 2023-07-27 LAB — COMPREHENSIVE METABOLIC PANEL WITH GFR
ALT: 11 U/L (ref 0–44)
AST: 25 U/L (ref 15–41)
Albumin: 4 g/dL (ref 3.5–5.0)
Alkaline Phosphatase: 49 U/L (ref 38–126)
Anion gap: 10 (ref 5–15)
BUN: 16 mg/dL (ref 8–23)
CO2: 25 mmol/L (ref 22–32)
Calcium: 9.3 mg/dL (ref 8.9–10.3)
Chloride: 103 mmol/L (ref 98–111)
Creatinine, Ser: 0.81 mg/dL (ref 0.44–1.00)
GFR, Estimated: >60 mL/min (ref >60)
Glucose, Bld: 105 mg/dL — ABNORMAL HIGH (ref 70–99)
Potassium: 4.1 mmol/L (ref 3.5–5.1)
Sodium: 138 mmol/L (ref 135–145)
Total Bilirubin: 0.8 mg/dL (ref 0.0–1.2)
Total Protein: 6.5 g/dL (ref 6.5–8.1)

## 2023-07-27 LAB — URINALYSIS, ROUTINE W REFLEX MICROSCOPIC
Bacteria, UA: NONE SEEN
Bilirubin Urine: NEGATIVE
Glucose, UA: NEGATIVE mg/dL
Ketones, ur: NEGATIVE mg/dL
Nitrite: NEGATIVE
Protein, ur: NEGATIVE mg/dL
Specific Gravity, Urine: 1.004 — ABNORMAL LOW (ref 1.005–1.030)
pH: 7 (ref 5.0–8.0)

## 2023-07-27 LAB — APTT: aPTT: 34 s (ref 24–36)

## 2023-07-27 LAB — CBC
HCT: 41.2 % (ref 36.0–46.0)
Hemoglobin: 13.7 g/dL (ref 12.0–15.0)
MCH: 31.4 pg (ref 26.0–34.0)
MCHC: 33.3 g/dL (ref 30.0–36.0)
MCV: 94.5 fL (ref 80.0–100.0)
Platelets: 196 K/uL (ref 150–400)
RBC: 4.36 MIL/uL (ref 3.87–5.11)
RDW: 12.9 % (ref 11.5–15.5)
WBC: 5.9 K/uL (ref 4.0–10.5)
nRBC: 0 % (ref 0.0–0.2)

## 2023-07-27 LAB — CBG MONITORING, ED
Glucose-Capillary: 81 mg/dL (ref 70–99)
Glucose-Capillary: 81 mg/dL (ref 70–99)

## 2023-07-27 LAB — ETHANOL: Alcohol, Ethyl (B): <15 mg/dL (ref <15)

## 2023-07-27 LAB — PROTIME-INR
INR: 1 (ref 0.8–1.2)
Prothrombin Time: 13.4 s (ref 11.4–15.2)

## 2023-07-27 NOTE — ED Notes (Signed)
 Patient Alert and oriented to baseline. Stable and ambulatory to baseline. Patient verbalized understanding of the discharge instructions.  Patient belongings were taken by the patient.

## 2023-07-27 NOTE — Discharge Instructions (Addendum)
 Your workup today was reassuring with no clear indication of what may be causing your symptoms.  There was no evidence of a stroke or other brain condition.

## 2023-07-27 NOTE — ED Notes (Signed)
 Patient transported to MRI

## 2023-07-27 NOTE — ED Provider Notes (Signed)
 [place] EMERGENCY DEPARTMENT AT [facility] Provider Note   CSN: [id number] Arrival date & time: 07/27/23  1058     Patient presents with: Altered Mental Status   Lisa Beltran is a 73 y.o. female With overall noncontributory past medical history presents concern for acute onset memory deficits per husband.  She is alert and oriented x 3, but has had gaps in her memory including not knowing what she did for July 4 just a couple of days ago, not knowing her daughter-in-law's birthday is today, reportedly not knowing that she plays tennis twice a week or who she plays with despite playing frequently.  No new numbness, tingling, facial droop, fall, head injury.  No previous stroke, TIA.  No previous history of memory issues.  No dizziness, no vision changes.  She does endorse a mild generalized headache, 5/10.    Altered Mental Status      Prior to Admission medications   Medication Sig Start Date End Date Taking? Authorizing Provider  azithromycin  (ZITHROMAX ) 500 MG tablet Take 2 tablets (1,000 mg total) by mouth once. 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 02/25/14   [provider]    Allergies: Penicillins    Review of Systems  All other systems reviewed and are negative.   Updated Vital Signs BP (!) 154/83   Pulse 69   Temp 98 F (36.7 C)   Resp 17   Ht 5' 4 (1.626 m)   Wt 49 kg   SpO2 100%   BMI 18.54 kg/m   Physical Exam Vitals and nursing note reviewed.  Constitutional:      General: She is not in acute distress.    Appearance: Normal appearance.  HENT:     Head: Normocephalic and atraumatic.  Eyes:     General:        Right eye: No discharge.        Left eye: No discharge.  Cardiovascular:     Rate and Rhythm: Normal rate and regular rhythm.     Heart sounds: No murmur heard.    No  friction rub. No gallop.  Pulmonary:     Effort: Pulmonary effort is normal.     Breath sounds: Normal breath sounds.  Abdominal:     General: Bowel sounds are normal.     Palpations: Abdomen is soft.  Skin:    General: Skin is warm and dry.     Capillary Refill: Capillary refill takes less than 2 seconds.  Neurological:     Mental Status: She is alert and oriented to person, place, and time.     Comments: Cranial nerves II through XII grossly intact.  Intact finger-nose, intact heel-to-shin.  Romberg negative, gait normal.  Alert and oriented x3.  Moves all 4 limbs spontaneously, normal coordination.  No pronator drift.  Intact strength 5 out of 5 bilateral upper and lower extremities.  Psychiatric:        Mood and Affect: Mood normal.        Behavior: Behavior normal.     (all labs ordered are listed, but only abnormal results are displayed) Labs Reviewed  COMPREHENSIVE METABOLIC PANEL WITH GFR - Abnormal; Notable for the following components:      Result Value   Glucose, Bld 105 (*)    All other  components within normal limits  URINALYSIS, ROUTINE W REFLEX MICROSCOPIC - Abnormal; Notable for the following components:   Color, Urine STRAW (*)    Specific Gravity, Urine 1.004 (*)    Hgb urine dipstick SMALL (*)    Leukocytes,Ua SMALL (*)    All other components within normal limits  CBC  ETHANOL  PROTIME-INR  APTT  CBG MONITORING, ED  CBG MONITORING, ED    EKG: None  Radiology: MR BRAIN WO CONTRAST Result Date: 07/27/2023 CLINICAL DATA:  Provided history: Neuro deficit, acute, stroke suspected. EXAM: MRI HEAD WITHOUT CONTRAST TECHNIQUE: Multiplanar, multiecho pulse sequences of the brain and surrounding structures were obtained without intravenous contrast. COMPARISON:  Head CT 07/27/2023. FINDINGS: Brain: No age-advanced or lobar predominant cerebral atrophy. No cortical encephalomalacia is identified. No significant cerebral white matter disease. There is no acute  infarct. No evidence of an intracranial mass. No chronic intracranial blood products. No extra-axial fluid collection. No midline shift. Vascular: Maintained flow voids within the proximal large arterial vessels. Skull and upper cervical spine: No focal worrisome marrow lesion. Incompletely assessed cervical spondylosis. Mild grade 1 anterolisthesis at C2-C3, C3-C4 and C4-C5. Sinuses/Orbits: No mass or acute finding within the imaged orbits. Prior bilateral ocular lens replacement. Depressed right orbital floor fracture without overlying soft tissue swelling, presumed chronic. Severe left maxillary sinus mucosal thickening. IMPRESSION: 1. Unremarkable non-contrast MRI appearance of the brain for age. No evidence of an acute intracranial abnormality. 2. Depressed right orbital floor fracture without overlying soft tissue swelling, presumed chronic. 3. Severe left maxillary sinus mucosal thickening. Electronically Signed   By: Rockey Childs D.O.   On: 07/27/2023 13:40   CT Head Wo Contrast Result Date: 07/27/2023 CLINICAL DATA:  Mental status change. EXAM: CT HEAD WITHOUT CONTRAST TECHNIQUE: Contiguous axial images were obtained from the base of the skull through the vertex without intravenous contrast. RADIATION DOSE REDUCTION: This exam was performed according to the departmental dose-optimization program which includes automated exposure control, adjustment of the mA and/or kV according to patient size and/or use of iterative reconstruction technique. COMPARISON:  None Available. FINDINGS: Brain: No acute intracranial hemorrhage. No CT evidence of acute infarct. No edema, mass effect, or midline shift. The basilar cisterns are patent. Ventricles: The ventricles are normal. Vascular: No hyperdense vessel or unexpected calcification. Skull: No acute or aggressive finding. Chronic fracture of the right orbital floor with herniation of extraconal fat into the adjacent maxillary sinus. Right inferior rectus muscle  extends over the fracture defect. Orbits: Orbits are symmetric. Sinuses: Mucosal thickening in the partially visualized left maxillary sinus. Other: Mastoid air cells are clear. IMPRESSION: No CT evidence of acute intracranial abnormality. Chronic appearing right orbital floor fracture. Right inferior rectus muscle extends over the fracture site without imaging findings of entrapment. Electronically Signed   By: Donnice Mania M.D.   On: 07/27/2023 12:19     Procedures   Medications Ordered in the ED - No data to display                                  Medical Decision Making Amount and/or Complexity of Data Reviewed Labs: ordered. Radiology: ordered.   This patient is a 73 y.o. female  who presents to the ED for concern of AMS.   Differential diagnoses prior to evaluation: The emergent differential diagnosis includes, but is not limited to,  CVA, seizure, hypotension, sepsis, hypoglycemia, hypoxic encephalopathy, metabolic encephalopathy, polypharmacy, substance  abuse, developing dementia or alzheimers, meningitis, encephalitis, hypertensive emergency, other systemic infection, acute alcohol  intoxication, acute alcohol  or other drug withdrawal or psychiatric manifestation vs other . This is not an exhaustive differential.   Past Medical History / Co-morbidities / Social History: Overall noncontributory  Physical Exam: Physical exam performed. The pertinent findings include: Somewhat hypertensive, blood pressure 168/95, otherwise vital signs stable.  She has no focal neurologic deficits on my exam.  Lab Tests/Imaging studies: I personally interpreted labs/imaging and the pertinent results include: UA with small hemoglobin, small leukocytes, she has no dysuria, overall with low clinical suspicion that this is compatible with acute urinary tract infection.  CMP unremarkable, CBC unremarkable, PT/INR, APTT are within normal limits, negative ethanol, normal CBG.  CT head and MR brain show a  remote right orbital floor fracture, as well as some left sided sinus inflammation, no evidence of acute stroke or other new neurologic abnormality. I agree with the radiologist interpretation.  Cardiac monitoring: EKG obtained and interpreted by myself and attending physician which shows: Normal sinus rhythm, no acute ST-T changes   Disposition: After consideration of the diagnostic results and the patients response to treatment, I feel that patient with nonspecific memory deficits that are new for her but otherwise is not altered, disoriented, and with no focal neurologic deficits.  Given new amnesia/memory loss events do recommend follow-up with neurology, stable for outpatient follow-up.  Patient understands and agrees to plan..   emergency department workup does not suggest an emergent condition requiring admission or immediate intervention beyond what has been performed at this time. The plan is: as above. The patient is safe for discharge and has been instructed to return immediately for worsening symptoms, change in symptoms or any other concerns.   Final diagnoses:  Memory deficit    ED Discharge Orders          Ordered    Ambulatory referral to Neurology       Comments: An appointment is requested in approximately: 2 weeks   07/27/23 1345               Madgeline Rayo, Sherlean VEAR RIGGERS 07/27/23 1350    Randol Simmonds, MD 07/28/23 [phone]

## 2023-07-27 NOTE — ED Triage Notes (Signed)
 Pt states her husband is concerned because she is not remembering things like what they did for July 4th or that her daughter in laws birthday is today, states this just started this morning. Pt states she has a slight headaches, pt denies numbness or tingling anywhere. Pt denies dizziness. NIHSS=0.

## 2023-08-04 DIAGNOSIS — R410 Disorientation, unspecified: Secondary | ICD-10-CM | POA: Diagnosis not present

## 2023-08-04 DIAGNOSIS — E039 Hypothyroidism, unspecified: Secondary | ICD-10-CM | POA: Diagnosis not present

## 2023-08-04 DIAGNOSIS — G43909 Migraine, unspecified, not intractable, without status migrainosus: Secondary | ICD-10-CM | POA: Diagnosis not present

## 2023-10-13 ENCOUNTER — Other Ambulatory Visit: Payer: Self-pay | Admitting: Internal Medicine

## 2023-10-13 DIAGNOSIS — Z1231 Encounter for screening mammogram for malignant neoplasm of breast: Secondary | ICD-10-CM

## 2023-10-17 ENCOUNTER — Ambulatory Visit
Admission: RE | Admit: 2023-10-17 | Discharge: 2023-10-17 | Disposition: A | Discharge status: Home / Self Care | Source: Ambulatory Visit | Attending: Internal Medicine | Admitting: Internal Medicine

## 2023-10-17 DIAGNOSIS — Z1231 Encounter for screening mammogram for malignant neoplasm of breast: Secondary | ICD-10-CM

## 2023-10-30 DIAGNOSIS — H353131 Nonexudative age-related macular degeneration, bilateral, early dry stage: Secondary | ICD-10-CM | POA: Diagnosis not present

## 2023-10-30 DIAGNOSIS — H40013 Open angle with borderline findings, low risk, bilateral: Secondary | ICD-10-CM | POA: Diagnosis not present

## 2023-10-30 DIAGNOSIS — H35373 Puckering of macula, bilateral: Secondary | ICD-10-CM | POA: Diagnosis not present

## 2023-10-30 DIAGNOSIS — H2513 Age-related nuclear cataract, bilateral: Secondary | ICD-10-CM | POA: Diagnosis not present

## 2023-10-30 DIAGNOSIS — H35413 Lattice degeneration of retina, bilateral: Secondary | ICD-10-CM | POA: Diagnosis not present

## 2023-10-30 DIAGNOSIS — H33312 Horseshoe tear of retina without detachment, left eye: Secondary | ICD-10-CM | POA: Diagnosis not present

## 2023-11-17 ENCOUNTER — Ambulatory Visit: Admitting: Neurology

## 2023-11-17 ENCOUNTER — Encounter: Payer: Self-pay | Admitting: Neurology

## 2023-11-17 VITALS — BP 127/85 | HR 74 | Ht 64.0 in | Wt 107.0 lb

## 2023-11-17 DIAGNOSIS — E7849 Other hyperlipidemia: Secondary | ICD-10-CM | POA: Diagnosis not present

## 2023-11-17 DIAGNOSIS — G454 Transient global amnesia: Secondary | ICD-10-CM | POA: Diagnosis not present

## 2023-11-17 DIAGNOSIS — E785 Hyperlipidemia, unspecified: Secondary | ICD-10-CM | POA: Diagnosis not present

## 2023-11-17 DIAGNOSIS — Z1212 Encounter for screening for malignant neoplasm of rectum: Secondary | ICD-10-CM | POA: Diagnosis not present

## 2023-11-17 DIAGNOSIS — E039 Hypothyroidism, unspecified: Secondary | ICD-10-CM | POA: Diagnosis not present

## 2023-11-17 NOTE — Progress Notes (Signed)
 [facility]  PATIENT: Lisa Beltran DOB: [birth year]  REQUESTING CLINICIAN: Prosperi, Sherlean DEL, * HISTORY FROM: Patient REASON FOR VISIT: Episode of amnesia   HISTORICAL  CHIEF COMPLAINT:  Chief Complaint  Patient presents with   RM 13    Consult: memory concerns: short term memory loss over summer; patient alone    HISTORY OF PRESENT ILLNESS:  Discussed the use of AI scribe software for clinical note transcription with the patient, who gave verbal consent to proceed.  Lisa Beltran is a 73 year old female with a history of hypothyroidism who presents after an episode of memory loss and confusion.  She experienced an episode of memory loss and confusion on the morning of July 6th, accompanied by slight pressure in the back of her head. She took Advil Migraine as a precaution due to her history of infrequent migraines. During the episode, she was unable to recall recent events, such as celebrating July 4th and sending a birthday gift to her daughter-in-law. Her memory returned to normal after about an hour in the ER.  This type of episode has never occurred before. Her brother experienced a similar event eight years ago, diagnosed as transient global amnesia.  She is currently taking Synthroid and Claritin. She is concerned about the consistency of her levothyroxine medication due to changes in pharmacy and potential recalls affecting potency. She is awaiting results from a recent thyroid  function test.  She is physically active, engaging in walking and swimming regularly. She noted feeling tired during the summer, attributing it to the heat and her physical activities.     OTHER MEDICAL CONDITIONS: Hypothyroidism, infrequent migraines   REVIEW OF SYSTEMS: Full 14 system review of systems performed and negative with exception of: As noted in HPI  ALLERGIES: Allergies  Allergen Reactions   Alendronate     Other Reaction(s): could not walk    Doxycycline Hyclate Other (See Comments)    Other Reaction(s): Other (See Comments)  Muscle aches   Muscle aches   Penicillins Rash    HOME MEDICATIONS: Outpatient Medications Prior to Visit  Medication Sig Dispense Refill   meloxicam (MOBIC) 15 MG tablet   1   SYNTHROID 75 MCG tablet Take 75 mcg by mouth daily.  5   azithromycin  (ZITHROMAX ) 500 MG tablet Take 2 tablets (1,000 mg total) by mouth once. 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    PAST MEDICAL HISTORY: History reviewed. No pertinent past medical history.  PAST SURGICAL HISTORY: History reviewed. No pertinent surgical history.  FAMILY HISTORY: Family History  Problem Relation Age of Onset   Breast cancer Neg Hx     SOCIAL HISTORY: Social History   Socioeconomic History   Marital status: Married    Spouse name: Not on file   Number of children: Not on file   Years of education: Not on file   Highest education level: Not on file  Occupational History   Not on file  Tobacco Use   Smoking status: Never   Smokeless tobacco: Never  Vaping Use   Vaping status: Never Used  Substance and Sexual Activity   Alcohol  use: Yes    Comment: 1 drink/week   Drug use: No   Sexual activity: Not on file  Other Topics Concern   Not on file  Social History Narrative   Not on file   Social Drivers of Health   Financial Resource Strain: Not  on file  Food Insecurity: Not on file  Transportation Needs: Not on file  Physical Activity: Not on file  Stress: Not on file  Social Connections: Not on file  Intimate Partner Violence: Not on file    PHYSICAL EXAM  GENERAL EXAM/CONSTITUTIONAL: Vitals:  Vitals:   11/17/23 1040  BP: 127/85  Pulse: 74  Weight: 107 lb (48.5 kg)  Height: 5' 4 (1.626 m)   Body mass index is 18.37 kg/m. Wt Readings from Last 3 Encounters:  11/17/23 107 lb (48.5 kg)  07/27/23 108 lb (49 kg)  11/05/11 118 lb (53.5  kg)   Patient is in no distress; well developed, nourished and groomed; neck is supple  MUSCULOSKELETAL: Gait, strength, tone, movements noted in Neurologic exam below  NEUROLOGIC: MENTAL STATUS:      No data to display            11/17/2023   10:54 AM  Montreal Cognitive Assessment   Visuospatial/ Executive (0/5) 5  Naming (0/3) 3  Attention: Read list of digits (0/2) 2  Attention: Read list of letters (0/1) 1  Attention: Serial 7 subtraction starting at 100 (0/3) 3  Language: Repeat phrase (0/2) 1  Language : Fluency (0/1) 1  Abstraction (0/2) 2  Delayed Recall (0/5) 4  Orientation (0/6) 6  Total 28    awake, alert, oriented to person, place and time recent and remote memory intact normal attention and concentration language fluent, comprehension intact, naming intact fund of knowledge appropriate  CRANIAL NERVE:  2nd, 3rd, 4th, 6th- visual fields full to confrontation, extraocular muscles intact, no nystagmus 5th - facial sensation symmetric 7th - facial strength symmetric 8th - hearing intact 9th - palate elevates symmetrically, uvula midline 11th - shoulder shrug symmetric 12th - tongue protrusion midline  MOTOR:  normal bulk and tone, full strength in the BUE, BLE  SENSORY:  normal and symmetric to light touch  COORDINATION:  finger-nose-finger, fine finger movements normal  GAIT/STATION:  normal   DIAGNOSTIC DATA (LABS, IMAGING, TESTING) - I reviewed patient records, labs, notes, testing and imaging myself where available.  Lab Results  Component Value Date   WBC 5.9 07/27/2023   HGB 13.7 07/27/2023   HCT 41.2 07/27/2023   MCV 94.5 07/27/2023   PLT 196 07/27/2023      Component Value Date/Time   NA 138 07/27/2023 1109   K 4.1 07/27/2023 1109   CL 103 07/27/2023 1109   CO2 25 07/27/2023 1109   GLUCOSE 105 (H) 07/27/2023 1109   BUN 16 07/27/2023 1109   CREATININE 0.81 07/27/2023 1109   CALCIUM 9.3 07/27/2023 1109   PROT 6.5  07/27/2023 1109   ALBUMIN 4.0 07/27/2023 1109   AST 25 07/27/2023 1109   ALT 11 07/27/2023 1109   ALKPHOS 49 07/27/2023 1109   BILITOT 0.8 07/27/2023 1109   GFRNONAA >60 07/27/2023 1109   No results found for: CHOL, HDL, LDLCALC, LDLDIRECT, TRIG, CHOLHDL No results found for: YHAJ8R No results found for: VITAMINB12 No results found for: TSH  MRI Brain 07/27/2023 1. Unremarkable non-contrast MRI appearance of the brain for age. No evidence of an acute intracranial abnormality. 2. Depressed right orbital floor fracture without overlying soft tissue swelling, presumed chronic. 3. Severe left maxillary sinus mucosal thickening    ASSESSMENT AND PLAN  73 y.o. year old female with history of hypothyroidism who is presenting after an episode of amnesia lasting about 2 to 3 hours.  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 MRI results were normal, and there is no current concern for stroke. - Educate about transient global amnesia and its benign nature  Hypothyroidism Hypothyroidism is managed with levothyroxine. There is concern about variability in medication due to changes in pharmacy and potential differences in generic formulations. She reports fatigue, but it is unclear if this is related to hypothyroidism or other factors such as heat and physical activity. - Follow up with primary care provider to review thyroid  function test results - Consider medication adjustment based on thyroid  function test results    1. TGA (transient global amnesia)     Patient Instructions  Continue follow-up with PCP Continue current medication Return as needed  No orders of the defined types were placed in this encounter.   No orders of the defined types were placed in this encounter.   Return if symptoms worsen or fail  to improve.    Pastor Falling, MD 11/17/2023, 11:13 AM  [facility] [address] [place], [place] [address] [phone]

## 2023-11-17 NOTE — Patient Instructions (Signed)
 Continue follow-up with PCP Continue current medication Return as needed

## 2023-11-17 NOTE — Progress Notes (Signed)
 Lisa Beltran

## 2023-11-24 DIAGNOSIS — M810 Age-related osteoporosis without current pathological fracture: Secondary | ICD-10-CM | POA: Diagnosis not present

## 2023-11-24 DIAGNOSIS — R410 Disorientation, unspecified: Secondary | ICD-10-CM | POA: Diagnosis not present

## 2023-11-24 DIAGNOSIS — M25519 Pain in unspecified shoulder: Secondary | ICD-10-CM | POA: Diagnosis not present

## 2023-11-24 DIAGNOSIS — R82998 Other abnormal findings in urine: Secondary | ICD-10-CM | POA: Diagnosis not present

## 2023-11-24 DIAGNOSIS — Z1339 Encounter for screening examination for other mental health and behavioral disorders: Secondary | ICD-10-CM | POA: Diagnosis not present

## 2023-11-24 DIAGNOSIS — E039 Hypothyroidism, unspecified: Secondary | ICD-10-CM | POA: Diagnosis not present

## 2023-11-24 DIAGNOSIS — E785 Hyperlipidemia, unspecified: Secondary | ICD-10-CM | POA: Diagnosis not present

## 2023-11-24 DIAGNOSIS — Z1331 Encounter for screening for depression: Secondary | ICD-10-CM | POA: Diagnosis not present

## 2023-11-24 DIAGNOSIS — R3121 Asymptomatic microscopic hematuria: Secondary | ICD-10-CM | POA: Diagnosis not present

## 2023-11-24 DIAGNOSIS — I868 Varicose veins of other specified sites: Secondary | ICD-10-CM | POA: Diagnosis not present

## 2023-11-24 DIAGNOSIS — R636 Underweight: Secondary | ICD-10-CM | POA: Diagnosis not present

## 2023-11-24 DIAGNOSIS — Z0000 Encounter for general adult medical examination without abnormal findings: Secondary | ICD-10-CM | POA: Diagnosis not present

## 2023-12-10 DIAGNOSIS — D692 Other nonthrombocytopenic purpura: Secondary | ICD-10-CM | POA: Diagnosis not present

## 2023-12-10 DIAGNOSIS — Z85828 Personal history of other malignant neoplasm of skin: Secondary | ICD-10-CM | POA: Diagnosis not present

## 2023-12-10 DIAGNOSIS — C44519 Basal cell carcinoma of skin of other part of trunk: Secondary | ICD-10-CM | POA: Diagnosis not present

## 2023-12-10 DIAGNOSIS — D485 Neoplasm of uncertain behavior of skin: Secondary | ICD-10-CM | POA: Diagnosis not present

## 2023-12-10 DIAGNOSIS — L578 Other skin changes due to chronic exposure to nonionizing radiation: Secondary | ICD-10-CM | POA: Diagnosis not present

## 2023-12-10 DIAGNOSIS — L570 Actinic keratosis: Secondary | ICD-10-CM | POA: Diagnosis not present

## 2023-12-10 DIAGNOSIS — L821 Other seborrheic keratosis: Secondary | ICD-10-CM | POA: Diagnosis not present

## 2023-12-10 DIAGNOSIS — C44629 Squamous cell carcinoma of skin of left upper limb, including shoulder: Secondary | ICD-10-CM | POA: Diagnosis not present

## 2023-12-10 DIAGNOSIS — L814 Other melanin hyperpigmentation: Secondary | ICD-10-CM | POA: Diagnosis not present

## 2023-12-10 DIAGNOSIS — D225 Melanocytic nevi of trunk: Secondary | ICD-10-CM | POA: Diagnosis not present

## 2023-12-13 ENCOUNTER — Other Ambulatory Visit: Payer: Self-pay

## 2023-12-13 ENCOUNTER — Emergency Department (HOSPITAL_BASED_OUTPATIENT_CLINIC_OR_DEPARTMENT_OTHER)
Admission: EM | Admit: 2023-12-13 | Discharge: 2023-12-13 | Disposition: A | Discharge status: Home / Self Care | Attending: Emergency Medicine | Admitting: Emergency Medicine

## 2023-12-13 ENCOUNTER — Emergency Department (HOSPITAL_BASED_OUTPATIENT_CLINIC_OR_DEPARTMENT_OTHER)

## 2023-12-13 ENCOUNTER — Encounter (HOSPITAL_BASED_OUTPATIENT_CLINIC_OR_DEPARTMENT_OTHER): Payer: Self-pay | Admitting: Emergency Medicine

## 2023-12-13 ENCOUNTER — Emergency Department (HOSPITAL_BASED_OUTPATIENT_CLINIC_OR_DEPARTMENT_OTHER): Admitting: Radiology

## 2023-12-13 ENCOUNTER — Other Ambulatory Visit (HOSPITAL_BASED_OUTPATIENT_CLINIC_OR_DEPARTMENT_OTHER): Payer: Self-pay

## 2023-12-13 DIAGNOSIS — S6992XA Unspecified injury of left wrist, hand and finger(s), initial encounter: Secondary | ICD-10-CM | POA: Diagnosis present

## 2023-12-13 DIAGNOSIS — S52592A Other fractures of lower end of left radius, initial encounter for closed fracture: Secondary | ICD-10-CM | POA: Diagnosis not present

## 2023-12-13 DIAGNOSIS — M25532 Pain in left wrist: Secondary | ICD-10-CM | POA: Diagnosis present

## 2023-12-13 DIAGNOSIS — Y9373 Activity, racquet and hand sports: Secondary | ICD-10-CM | POA: Diagnosis not present

## 2023-12-13 DIAGNOSIS — M84439A Pathological fracture, unspecified ulna and radius, initial encounter for fracture: Secondary | ICD-10-CM | POA: Diagnosis not present

## 2023-12-13 DIAGNOSIS — W1839XA Other fall on same level, initial encounter: Secondary | ICD-10-CM | POA: Diagnosis not present

## 2023-12-13 DIAGNOSIS — S52612D Displaced fracture of left ulna styloid process, subsequent encounter for closed fracture with routine healing: Secondary | ICD-10-CM | POA: Insufficient documentation

## 2023-12-13 DIAGNOSIS — X58XXXD Exposure to other specified factors, subsequent encounter: Secondary | ICD-10-CM | POA: Diagnosis not present

## 2023-12-13 DIAGNOSIS — S52352A Displaced comminuted fracture of shaft of radius, left arm, initial encounter for closed fracture: Secondary | ICD-10-CM | POA: Diagnosis not present

## 2023-12-13 DIAGNOSIS — S52572A Other intraarticular fracture of lower end of left radius, initial encounter for closed fracture: Secondary | ICD-10-CM | POA: Diagnosis not present

## 2023-12-13 DIAGNOSIS — S52502A Unspecified fracture of the lower end of left radius, initial encounter for closed fracture: Secondary | ICD-10-CM | POA: Insufficient documentation

## 2023-12-13 MED ORDER — OXYCODONE-ACETAMINOPHEN 5-325 MG PO TABS
1.0000 | ORAL_TABLET | Freq: Four times a day (QID) | ORAL | 0 refills | Status: AC | PRN
  Filled 2023-12-13: qty 3, 1d supply, fill #0

## 2023-12-13 MED ORDER — LIDOCAINE HCL (PF) 1 % IJ SOLN
30.0000 mL | Freq: Once | INTRAMUSCULAR | Status: AC
  Administered 2023-12-13: 30 mL
  Filled 2023-12-13: qty 30

## 2023-12-13 NOTE — ED Triage Notes (Signed)
 Pt endorses fall while playing tennis this morning. C/o LT wrist pain, swelling noted. Denies thinners, denies head injury

## 2023-12-13 NOTE — ED Provider Notes (Signed)
 [place] EMERGENCY DEPARTMENT AT [facility] Provider Note   CSN: [id number] Arrival date & time: 12/13/23  9045     Patient presents with: [place]   JOYLEEN HASELTON is a 73 y.o. female with no pertinent medical history who presents emergency department for evaluation of left wrist pain after fall.  Patient reports she was playing tennis this morning when she fell on her left glutes and braced herself with her left wrist.  She denies hitting her head and denies LOC.  She does report some pain and swelling to her wrist.  She denies elbow pain or shoulder pain.  Patient does not take any blood thinners.    Fall       Prior to Admission medications   Medication Sig Start Date End Date Taking? Authorizing Provider  meloxicam (MOBIC) 15 MG tablet  02/21/14   [provider]  SYNTHROID 75 MCG tablet Take 75 mcg by mouth daily. 02/25/14   [provider]    Allergies: Alendronate, Doxycycline hyclate, and Penicillins    Review of Systems  Musculoskeletal:  Positive for joint swelling.    Updated Vital Signs BP (!) 148/72   Pulse 83   Temp 98.2 F (36.8 C) (Oral)   Resp 16   Ht 5' 4 (1.626 m)   Wt 49 kg   SpO2 98%   BMI 18.54 kg/m   Physical Exam Vitals and nursing note reviewed.  Constitutional:      Appearance: Normal appearance. She is not ill-appearing.  Eyes:     General: No scleral icterus. Pulmonary:     Effort: Pulmonary effort is normal. No respiratory distress.  Musculoskeletal:        General: Swelling and deformity present.     Comments: Obvious left wrist swelling and deformity noted.  Patient able to move her fingers with minimal difficulty.  Radial pulses intact.  No difficulty with elbow or shoulder range of motion.  Patient's sensation is 5 out of 5.  No additional tenderness to patient's back, hips, legs noted on palpation.  No additional deformities, bruising, abrasions.  Patient's hips are stable.  No C-spine tenderness.   Skin:    Coloration: Skin is not jaundiced.  Neurological:     General: No focal deficit present.     Mental Status: She is alert.  Psychiatric:        Mood and Affect: Mood normal.     (all labs ordered are listed, but only abnormal results are displayed) Labs Reviewed - No data to display  EKG: None  Radiology: DG Hand Complete Left Result Date: 12/13/2023 EXAM: 3 OR MORE VIEW(S) XRAY OF THE LEFT HAND 12/13/2023 10:39:00 AM COMPARISON: left wrist series detailed separately today. CLINICAL HISTORY: 73 year old female. Wrist deformity, FOOSh. FINDINGS: BONES AND JOINTS: Dorsally displaced and angulated, intra-articular, comminuted distal radial fracture. Distal radioulnar joint involvement. No other acute fracture or dislocation identified about the left hand. SOFT TISSUES: Soft tissue swelling about the wrist. IMPRESSION: 1. Distal left radius fracture is detailed separately today. 2. No other acute fracture or dislocation identified about the left hand. Electronically signed by: Helayne Hurst MD 12/13/2023 11:05 AM EST RP Workstation: [id number]   DG Wrist Complete Left Result Date: 12/13/2023 EXAM: 3 or more VIEW(S) XRAY OF THE LEFT WRIST 12/13/2023 10:39:00 AM COMPARISON: None available. CLINICAL HISTORY: 73 year old female. Fall, swelling. FINDINGS: BONES AND JOINTS: Acute dorsally displaced, angulated, intra-articular distal radial fracture. Dorsal angulation of the distal fracture fragment. Chronic appearing ulnar  styloid fracture. No joint dislocation. SOFT TISSUES: Subcutaneous soft tissue edema. IMPRESSION: 1. Acute comminuted and dorsally angulated distal left radius. 2. Chronic-appearing ulnar styloid fracture. Electronically signed by: Helayne Hurst MD 12/13/2023 11:03 AM EST RP Workstation: [id number]     Procedures   Medications Ordered in the ED  lidocaine  (PF) (XYLOCAINE ) 1 % injection 30 mL (has no administration in time range)                                Medical  Decision Making Amount and/or Complexity of Data Reviewed Radiology: ordered.  Risk Prescription drug management.   This patient presents to the ED for concern of left wrist pain after a fall, this involves an extensive number of treatment options, and is a complaint that carries with it a high risk of complications and morbidity.   Differential diagnosis includes: Distal radius fracture, distal ulnar fracture, radius dislocation, ulnar dislocation, elbow dislocation, wrist sprain, wrist strain  Co morbidities:  none  Lab Tests:  Not indicated  Imaging Studies:  I ordered imaging studies including left wrist and hand x-ray I independently visualized and interpreted imaging which showed   1. Acute comminuted and dorsally angulated distal left radius.  2. Chronic-appearing ulnar styloid fracture.  No acute abnormalities found in patient's hand.  I agree with the radiologist interpretation  Cardiac Monitoring/ECG:  The patient was maintained on a cardiac monitor.  I personally viewed and interpreted the cardiac monitored which showed an underlying rhythm of: Normal sinus  Medicines ordered and prescription drug management:  I ordered medication including  Medications  lidocaine  (PF) (XYLOCAINE ) 1 % injection 30 mL (has no administration in time range)   for hematoma block Reevaluation of the patient after these medicines showed that the patient improved I have reviewed the patients home medicines and have made adjustments as needed  Test Considered:   none  Critical Interventions:   none  Consultations Obtained: None  Problem List / ED Course:     ICD-10-CM   1. Closed fracture of distal end of left radius, unspecified fracture morphology, initial encounter  S52.502A       MDM: 73 year old female who presents emergency department for evaluation after a FOOSH injury today while playing tennis.  Patient fell backwards landing on her right gluteus and left  wrist.  Patient presents with an obvious left wrist deformity and swelling.  Patient's radial pulse is intact.  She is able to move her distal phalanges in all directions.  Sensation is intact.  No other obvious deformities, swelling, abrasions noted.  Pelvis is stable.  Left wrist x-ray shows acute comminuted and dorsally angulated left distal radius fracture.  This patient was seen in conjunction with Dr. Mannie, who will place a hematoma block for numbing.  We will then plan to reduce the patient's wrist and placed in a short arm splint.  Repeat wrist x-ray post reduction visualized by MD, who cleared this patient for discharge.  Please see Dr. Mannie procedural note for additional information.  Patient will need outpatient orthopedic follow-up.  I have given the patient a referral to Ortho, Dr. Shari, and recommended she call on Monday to schedule her follow-up appointment.  Patient has verbalized her understanding to this.  Patient has been given appropriate education on temporary splint.   Dispostion:  After consideration of the diagnostic results and the patients response to treatment, I feel that the patient would benefit from  outpatient orthopedic follow-up.   Final diagnoses:  Closed fracture of distal end of left radius, unspecified fracture morphology, initial encounter    ED Discharge Orders     None          Torrence Marry RAMAN, PA-C 12/13/23 1303    Mannie Pac T, DO 12/14/23 [phone]

## 2023-12-13 NOTE — ED Notes (Signed)
 ED Provider at bedside.

## 2023-12-13 NOTE — Discharge Instructions (Addendum)
 It was a pleasure taking care of you today. You were seen in the Emergency Department for left wrist injury after a fall. Your work-up was reassuring. Your x-ray showed a left distal radius fracture.  We have reduced this in the emergency department and placed you in a temporary splint.  You will need to follow-up with orthopedics, Dr. Ahmad.  Please call the office on Monday to schedule a follow-up appointment for further workup.  I have provided you with his contact information on your discharge paperwork.  Additionally, I have sent in 3 pills of Percocet to take as needed for pain control.  Please be advised if you take this medication, it may cause drowsiness so use caution when driving or operating heavy machinery.  Refer to the attached documentation for further management of your symptoms.   Please return to the ER if you experience chest pain, trouble breathing, intractable nausea/vomiting or any other life threatening illnesses.

## 2023-12-15 DIAGNOSIS — S52552A Other extraarticular fracture of lower end of left radius, initial encounter for closed fracture: Secondary | ICD-10-CM | POA: Diagnosis not present

## 2023-12-22 DIAGNOSIS — Z961 Presence of intraocular lens: Secondary | ICD-10-CM | POA: Diagnosis not present

## 2023-12-23 DIAGNOSIS — Y999 Unspecified external cause status: Secondary | ICD-10-CM | POA: Diagnosis not present

## 2023-12-23 DIAGNOSIS — X58XXXA Exposure to other specified factors, initial encounter: Secondary | ICD-10-CM | POA: Diagnosis not present

## 2023-12-23 DIAGNOSIS — S52552A Other extraarticular fracture of lower end of left radius, initial encounter for closed fracture: Secondary | ICD-10-CM | POA: Diagnosis not present

## 2023-12-24 ENCOUNTER — Other Ambulatory Visit (HOSPITAL_BASED_OUTPATIENT_CLINIC_OR_DEPARTMENT_OTHER): Payer: Self-pay

## 2024-01-02 DIAGNOSIS — S52572D Other intraarticular fracture of lower end of left radius, subsequent encounter for closed fracture with routine healing: Secondary | ICD-10-CM | POA: Diagnosis not present
